# Patient Record
Sex: Male | Born: 1950 | Race: White | Hispanic: No | Marital: Married
Health system: Southern US, Community
[De-identification: ages and names within clinical notes are randomized; demographics above are authoritative.]

---

## 2007-06-19 ENCOUNTER — Observation Stay (HOSPITAL_COMMUNITY): Admission: EM | Admit: 2007-06-19 | Discharge: 2007-06-20 | Payer: Self-pay | Admitting: Emergency Medicine

## 2011-03-23 NOTE — H&P (Signed)
NAMERONEY, YOUTZ NO.:  [id number]   MEDICAL RECORD NO.:  [id number]          PATIENT TYPE:  EMS   LOCATION:  ED                           FACILITY:  [facility]   PHYSICIAN:  Ladell Pier, M.D.   DATE OF BIRTH:  07/27/1951   DATE OF ADMISSION:  06/19/2007  DATE OF DISCHARGE:                              HISTORY & PHYSICAL   CHIEF COMPLAINT:  Lightheadedness.   HISTORY OF PRESENT ILLNESS:  The patient is a 60 year old white male  that presented to the ER with nausea and dry heaves this a.m.  Per the  patient this morning he had a banana and some crackers and went to work.  He had penicillin that he needed to take for a dental procedure. He took  the B penicillin and antibiotics.  About 15 minutes after taking the  antibiotics, he started feeling really lightheaded and dizzy and clammy  with tingling in both arms.  He just continued to feel worse so he went  to the health nurse. He had his blood pressure second checked it was in  the 140s systolic.  911 was called. On arriving, he was noted to have a  blood pressure of 95/37 and hypothermic.  He stated that he had a  similar event back in April when he was admitted to [facility].  This happened about 15-20 minutes after taking tetracycline,  doxycycline, and antibiotics for a tick bite.  He states that he became  clammy, sweaty then had a new syncopal episode.  He was admitted to  [facility] and had a lot a workup done including a stress test,  CAT scan of the head.  Carotid Dopplers all normal.  He had lots of  blood work done at the time.  He stated that he followed up with  neurosurgery for carotid Dopplers and they would normally follow up with  cardiology even just a few weeks ago and everything checked out fine  except for the elevated blood pressure for which he was given a  prescription for blood pressure medicine but has not refilled it yet.  He had no chest pain or shortness of breath. This  time he had chest pain  and shortness of breath on the previous occasion.  He stated that he has  taken penicillin in the past but he had not had a problem with the  medication before.   PAST MEDICAL HISTORY:  Significant for hypertension and status post  appendectomy, also migraine headaches.   FAMILY HISTORY:  Mother is 55 and healthy.  Father died at 31 from lung  cancer.   SOCIAL HISTORY:  Tobacco, he smokes 1-1/2 packs per day and drinks  socially. He is married one child. He is an Retail banker.   MEDICATIONS:  None at present.   ALLERGIES:  Before none but now has PENICILLIN and TETRACYCLINE on the  list.   REVIEW OF SYSTEMS:  As per stated in the HPI.   PHYSICAL EXAM:  VITAL SIGNS:  Temperature 95.1 on admission with a bear  hugger, now his temperature is 98.3, blood pressure  95/37 now 118/49,  pulse of 72, respirations 20, pulse ox 93% on room air.  HEENT:  Normocephalic, atraumatic.  Pupils reactive to light. Throat  without erythema.  CARDIOVASCULAR:  Regular rate and rhythm.  LUNGS:  Clear bilaterally.  ABDOMEN:  Positive bowel sounds.  EXTREMITIES:  Without edema.  NEUROLOGIC:  Nonfocal.   LABORATORY DATA:  WBC 18.7, hemoglobin 17.68, platelets 443, MCV 97.4.  Sodium 137, potassium 3.3, chloride 107, CO2 22. BUN 9, creatinine 0.8,  glucose 166.  Cardiac enzymes normal. EKG no acute ST-segment elevation  or depression.  Chest x-ray shows atelectasis, no active disease.  UA  negative.   ASSESSMENT/PLAN:  1. Near-syncopal episode:  Sounds like he had an anaphylactic reaction      to the antibiotic on both occasions.  Will admit the patient for      observation. Will monitor in tele and check blood pressure, give IV      fluids and do blood cultures x2 with the elevated white count.  2. Leukocytosis:  This could be a left shift secondary to stress      reaction.  Will recheck his white count and check blood cultures      x2.  3. Hypertension, blood pressure  looks good now.  Continue IV fluids.  4. Tobacco use. Encourage smoking cessation.  5. Obesity. Encourage diet and exercise.      Ladell Pier, M.D.  Electronically Signed     NJ/MEDQ  D:  06/19/2007  T:  06/20/2007  Job:  [id number]

## 2011-03-23 NOTE — Discharge Summary (Signed)
NAMECOLBIN, JOVEL               ACCOUNT NO.:  [id number]   MEDICAL RECORD NO.:  [id number]          PATIENT TYPE:  INP   LOCATION:  1444                         FACILITY:  [facility]   PHYSICIAN:  Ladell Pier, M.D.   DATE OF BIRTH:  August 27, 1951   DATE OF ADMISSION:  06/19/2007  DATE OF DISCHARGE:                               DISCHARGE SUMMARY   DISCHARGE DIAGNOSES:  1. Near syncopal episode with lightheadedness secondary to most likely      anaphylactic reaction to taking penicillin.  2. Leukocytosis, resolved.  3. Hypertension.  4. Tobacco use.  5. Obesity.   DISCHARGE MEDICATIONS:  1. Patient to take the blood pressure medicine given to him by his      cardiologist.  2. Also aspirin 81 mg daily.  3. Ambien 10 mg at bedtime p.r.n. for sleep.   FOLLOW-UP APPOINTMENTS:  The patient is to follow up with PCP in 1-2  weeks.   PROCEDURES:  None.   CONSULTANTS:  None.   HISTORY OF PRESENT ILLNESS:  The patient is a 60 year old white male who  presented to the ER with nausea, dry heaves since that morning. Per the  patient that morning he had a banana and some crackers and went to work.  He had a penicillin that he needed to take prior to getting a dental  procedure. He took this penicillin and about 15 minutes after taking the  antibiotic he started feeling real lightheaded and dizzy, clammy, with  tingling in both arms. He continued to feel worse so he went to the  health nurse. He had his blood pressure checked and it was 140 systolic;  911 was called. On arrival he was noted to have a blood pressure of  95/37, he was hypothermic. Stated that he had a similar event back in  April when he was admitted to [facility] and that happened a few  minutes after he took tetracycline for a tick bite. He states that he  became clammy, sweaty, with near syncopal episode then. He had an  extensive work up at [facility] without any conclusive diagnosis.   Past medical history, family  history, social history, medications,  allergies, review of systems: Refer to admission H and P.   CONDITION ON DISCHARGE:  Temperature 989.3, pulse 67, respirations 18,  blood pressure 156/70, pulse ox 95% on room air. HEENT, head is  normocephalic, atraumatic. Pupils are equal, round and reactive to  light. No erythema. Cardiovascular shows a regular rate and rhythm.  Lungs are clear bilaterally. Abdomen has positive bowel sounds.  Extremities are without edema.   HOSPITAL COURSE:  1. Anaphylactic reaction. This most likely was secondary to the      penicillin and probably the first time secondary to tetracycline.      Discussed with him to add penicillin and tetracycline to his      allergy list and not to take any antibiotic in that class in the      future. Also discussed this with the wife.  2. Hypertension. He just got his blood pressure medication filled from  the cardiologist, but he has not started it. He will start it when      he goes home.  3. Tobacco. Counseled smoking cessation.  4. Leukocytosis, resolved.   DISCHARGE LABS:  Blood cultures negative x2. BMP: Sodium 140, potassium  3.7, chloride 110, CO2 23, BUN 5, creatinine 0.74, glucose 108. WBC 9.9,  hemoglobin 13.9, platelets 309. TSH 0.531. D-dimer 0.43.   Chest x-ray showed mild left basilar atelectasis/scarring.      Ladell Pier, M.D.  Electronically Signed     NJ/MEDQ  D:  06/20/2007  T:  06/21/2007  Job:  [id number]

## 2011-08-23 LAB — CBC
HCT: 39.2
HCT: 49.1
Hemoglobin: 13.9
Hemoglobin: 17.3 — ABNORMAL HIGH
MCHC: 35.2
MCV: 97.8
RBC: 5.02
RDW: 13
RDW: 13
WBC: 9.9

## 2011-08-23 LAB — URINALYSIS, ROUTINE W REFLEX MICROSCOPIC
Bilirubin Urine: NEGATIVE
Glucose, UA: NEGATIVE
Hgb urine dipstick: NEGATIVE
Ketones, ur: NEGATIVE
Leukocytes, UA: NEGATIVE
Protein, ur: 30 — AB
pH: 6.5

## 2011-08-23 LAB — DIFFERENTIAL
Basophils Absolute: 0
Basophils Relative: 0
Eosinophils Absolute: 0
Eosinophils Relative: 0
Monocytes Absolute: 0.5
Monocytes Relative: 3
Neutro Abs: 15 — ABNORMAL HIGH

## 2011-08-23 LAB — COMPREHENSIVE METABOLIC PANEL
ALT: 21
Albumin: 3.5
Alkaline Phosphatase: 64
BUN: 9
Chloride: 107
Potassium: 3.3 — ABNORMAL LOW
Sodium: 137
Total Bilirubin: 0.5
Total Protein: 6.1

## 2011-08-23 LAB — D-DIMER, QUANTITATIVE: D-Dimer, Quant: 0.43

## 2011-08-23 LAB — BASIC METABOLIC PANEL
BUN: 5 — ABNORMAL LOW
Chloride: 110
GFR calc non Af Amer: >60
Glucose, Bld: 108 — ABNORMAL HIGH
Potassium: 3.7
Sodium: 140

## 2011-08-23 LAB — CULTURE, BLOOD (ROUTINE X 2): Culture: NO GROWTH

## 2011-08-23 LAB — CK TOTAL AND CKMB (NOT AT ARMC): CK, MB: 3.4

## 2016-10-12 DIAGNOSIS — Z720 Tobacco use: Secondary | ICD-10-CM | POA: Diagnosis not present

## 2016-10-12 DIAGNOSIS — G43709 Chronic migraine without aura, not intractable, without status migrainosus: Secondary | ICD-10-CM | POA: Diagnosis not present

## 2016-10-12 DIAGNOSIS — E785 Hyperlipidemia, unspecified: Secondary | ICD-10-CM | POA: Diagnosis not present

## 2016-10-12 DIAGNOSIS — Z125 Encounter for screening for malignant neoplasm of prostate: Secondary | ICD-10-CM | POA: Diagnosis not present

## 2016-10-12 DIAGNOSIS — I10 Essential (primary) hypertension: Secondary | ICD-10-CM | POA: Diagnosis not present

## 2016-10-12 DIAGNOSIS — F5104 Psychophysiologic insomnia: Secondary | ICD-10-CM | POA: Diagnosis not present

## 2016-12-03 DIAGNOSIS — F5104 Psychophysiologic insomnia: Secondary | ICD-10-CM | POA: Diagnosis not present

## 2016-12-03 DIAGNOSIS — G5793 Unspecified mononeuropathy of bilateral lower limbs: Secondary | ICD-10-CM | POA: Diagnosis not present

## 2016-12-03 DIAGNOSIS — I10 Essential (primary) hypertension: Secondary | ICD-10-CM | POA: Diagnosis not present

## 2016-12-03 DIAGNOSIS — N400 Enlarged prostate without lower urinary tract symptoms: Secondary | ICD-10-CM | POA: Diagnosis not present

## 2016-12-03 DIAGNOSIS — F17210 Nicotine dependence, cigarettes, uncomplicated: Secondary | ICD-10-CM | POA: Diagnosis not present

## 2016-12-03 DIAGNOSIS — F4322 Adjustment disorder with anxiety: Secondary | ICD-10-CM | POA: Diagnosis not present

## 2016-12-03 DIAGNOSIS — K219 Gastro-esophageal reflux disease without esophagitis: Secondary | ICD-10-CM | POA: Diagnosis not present

## 2016-12-03 DIAGNOSIS — E876 Hypokalemia: Secondary | ICD-10-CM | POA: Diagnosis not present

## 2017-04-22 DIAGNOSIS — D2272 Melanocytic nevi of left lower limb, including hip: Secondary | ICD-10-CM | POA: Diagnosis not present

## 2017-04-22 DIAGNOSIS — D2261 Melanocytic nevi of right upper limb, including shoulder: Secondary | ICD-10-CM | POA: Diagnosis not present

## 2017-04-22 DIAGNOSIS — D225 Melanocytic nevi of trunk: Secondary | ICD-10-CM | POA: Diagnosis not present

## 2017-04-22 DIAGNOSIS — L570 Actinic keratosis: Secondary | ICD-10-CM | POA: Diagnosis not present

## 2017-04-22 DIAGNOSIS — L821 Other seborrheic keratosis: Secondary | ICD-10-CM | POA: Diagnosis not present

## 2017-04-22 DIAGNOSIS — X32XXXA Exposure to sunlight, initial encounter: Secondary | ICD-10-CM | POA: Diagnosis not present

## 2017-06-13 DIAGNOSIS — I10 Essential (primary) hypertension: Secondary | ICD-10-CM | POA: Diagnosis not present

## 2017-06-13 DIAGNOSIS — F4322 Adjustment disorder with anxiety: Secondary | ICD-10-CM | POA: Diagnosis not present

## 2017-06-13 DIAGNOSIS — K219 Gastro-esophageal reflux disease without esophagitis: Secondary | ICD-10-CM | POA: Diagnosis not present

## 2017-06-13 DIAGNOSIS — I872 Venous insufficiency (chronic) (peripheral): Secondary | ICD-10-CM | POA: Diagnosis not present

## 2017-06-13 DIAGNOSIS — F5104 Psychophysiologic insomnia: Secondary | ICD-10-CM | POA: Diagnosis not present

## 2017-09-07 DIAGNOSIS — M25532 Pain in left wrist: Secondary | ICD-10-CM | POA: Diagnosis not present

## 2017-09-07 DIAGNOSIS — G8929 Other chronic pain: Secondary | ICD-10-CM | POA: Diagnosis not present

## 2017-10-05 DIAGNOSIS — M25562 Pain in left knee: Secondary | ICD-10-CM | POA: Diagnosis not present

## 2017-10-05 DIAGNOSIS — M1711 Unilateral primary osteoarthritis, right knee: Secondary | ICD-10-CM | POA: Diagnosis not present

## 2017-10-05 DIAGNOSIS — M25561 Pain in right knee: Secondary | ICD-10-CM | POA: Diagnosis not present

## 2017-10-05 DIAGNOSIS — G8929 Other chronic pain: Secondary | ICD-10-CM | POA: Diagnosis not present

## 2018-05-24 ENCOUNTER — Other Ambulatory Visit
Admission: RE | Admit: 2018-05-24 | Discharge: 2018-05-24 | Disposition: A | Discharge status: Home / Self Care | Payer: Medicare HMO | Source: Ambulatory Visit | Attending: Gastroenterology | Admitting: Gastroenterology

## 2018-05-24 DIAGNOSIS — K529 Noninfective gastroenteritis and colitis, unspecified: Secondary | ICD-10-CM | POA: Insufficient documentation

## 2018-05-24 LAB — GASTROINTESTINAL PANEL BY PCR, STOOL (REPLACES STOOL CULTURE)

## 2018-05-24 LAB — C DIFFICILE QUICK SCREEN W PCR REFLEX
C DIFFICILE (CDIFF) TOXIN: NEGATIVE
C Diff antigen: NEGATIVE
C Diff interpretation: NOT DETECTED

## 2018-11-15 ENCOUNTER — Other Ambulatory Visit: Payer: Self-pay | Admitting: Family Medicine

## 2018-11-15 DIAGNOSIS — L03115 Cellulitis of right lower limb: Secondary | ICD-10-CM

## 2018-11-15 DIAGNOSIS — M7989 Other specified soft tissue disorders: Secondary | ICD-10-CM

## 2018-11-23 ENCOUNTER — Ambulatory Visit: Payer: Medicare HMO | Attending: Family Medicine

## 2018-12-08 ENCOUNTER — Ambulatory Visit: Admission: RE | Admit: 2018-12-08 | Payer: Medicare HMO | Source: Home / Self Care | Admitting: Gastroenterology

## 2018-12-08 ENCOUNTER — Encounter: Admission: RE | Payer: Self-pay | Source: Home / Self Care

## 2018-12-08 SURGERY — COLONOSCOPY WITH PROPOFOL
Anesthesia: General

## 2019-03-29 ENCOUNTER — Other Ambulatory Visit: Payer: Self-pay | Admitting: Unknown Physician Specialty

## 2019-03-29 ENCOUNTER — Other Ambulatory Visit (HOSPITAL_COMMUNITY): Payer: Self-pay | Admitting: Unknown Physician Specialty

## 2019-03-29 DIAGNOSIS — M25532 Pain in left wrist: Secondary | ICD-10-CM

## 2019-03-29 DIAGNOSIS — G8929 Other chronic pain: Secondary | ICD-10-CM

## 2019-04-05 ENCOUNTER — Other Ambulatory Visit (HOSPITAL_COMMUNITY): Payer: Self-pay | Admitting: Physician Assistant

## 2019-04-05 ENCOUNTER — Ambulatory Visit (HOSPITAL_COMMUNITY)
Admission: RE | Admit: 2019-04-05 | Discharge: 2019-04-05 | Disposition: A | Discharge status: Home / Self Care | Payer: Medicare HMO | Source: Ambulatory Visit | Attending: Unknown Physician Specialty | Admitting: Unknown Physician Specialty

## 2019-04-05 ENCOUNTER — Other Ambulatory Visit (HOSPITAL_COMMUNITY): Payer: Self-pay | Admitting: Unknown Physician Specialty

## 2019-04-05 ENCOUNTER — Other Ambulatory Visit: Payer: Self-pay

## 2019-04-05 DIAGNOSIS — R52 Pain, unspecified: Secondary | ICD-10-CM | POA: Insufficient documentation

## 2019-04-05 DIAGNOSIS — G8929 Other chronic pain: Secondary | ICD-10-CM | POA: Diagnosis present

## 2019-04-05 DIAGNOSIS — M25532 Pain in left wrist: Secondary | ICD-10-CM | POA: Diagnosis present

## 2020-03-21 ENCOUNTER — Other Ambulatory Visit: Payer: Self-pay | Admitting: Orthopedic Surgery

## 2020-03-21 DIAGNOSIS — M5137 Other intervertebral disc degeneration, lumbosacral region: Secondary | ICD-10-CM

## 2020-03-21 DIAGNOSIS — G8929 Other chronic pain: Secondary | ICD-10-CM

## 2020-03-21 DIAGNOSIS — M5416 Radiculopathy, lumbar region: Secondary | ICD-10-CM

## 2020-06-16 ENCOUNTER — Other Ambulatory Visit: Payer: Medicare HMO

## 2020-07-28 ENCOUNTER — Other Ambulatory Visit: Payer: Medicare HMO

## 2020-10-01 ENCOUNTER — Other Ambulatory Visit: Payer: Self-pay | Admitting: Orthopedic Surgery

## 2020-10-01 DIAGNOSIS — M5442 Lumbago with sciatica, left side: Secondary | ICD-10-CM

## 2020-10-01 DIAGNOSIS — M5137 Other intervertebral disc degeneration, lumbosacral region: Secondary | ICD-10-CM

## 2020-10-01 DIAGNOSIS — M545 Low back pain, unspecified: Secondary | ICD-10-CM

## 2020-10-01 DIAGNOSIS — M5416 Radiculopathy, lumbar region: Secondary | ICD-10-CM

## 2020-10-01 DIAGNOSIS — G8929 Other chronic pain: Secondary | ICD-10-CM

## 2020-10-16 ENCOUNTER — Ambulatory Visit
Admission: RE | Admit: 2020-10-16 | Discharge: 2020-10-16 | Disposition: A | Discharge status: Home / Self Care | Payer: Medicare HMO | Source: Ambulatory Visit | Attending: Orthopedic Surgery | Admitting: Orthopedic Surgery

## 2020-10-16 ENCOUNTER — Other Ambulatory Visit: Payer: Self-pay

## 2020-10-16 DIAGNOSIS — G8929 Other chronic pain: Secondary | ICD-10-CM | POA: Insufficient documentation

## 2020-10-16 DIAGNOSIS — M545 Low back pain, unspecified: Secondary | ICD-10-CM | POA: Diagnosis present

## 2020-10-16 DIAGNOSIS — M5442 Lumbago with sciatica, left side: Secondary | ICD-10-CM | POA: Diagnosis present

## 2020-12-10 ENCOUNTER — Ambulatory Visit: Payer: Medicare HMO | Admitting: Dermatology

## 2020-12-31 ENCOUNTER — Ambulatory Visit: Payer: Medicare HMO | Admitting: Dermatology

## 2020-12-31 ENCOUNTER — Other Ambulatory Visit: Payer: Self-pay

## 2020-12-31 DIAGNOSIS — C4491 Basal cell carcinoma of skin, unspecified: Secondary | ICD-10-CM

## 2020-12-31 DIAGNOSIS — L821 Other seborrheic keratosis: Secondary | ICD-10-CM

## 2020-12-31 DIAGNOSIS — D229 Melanocytic nevi, unspecified: Secondary | ICD-10-CM

## 2020-12-31 DIAGNOSIS — L700 Acne vulgaris: Secondary | ICD-10-CM | POA: Diagnosis not present

## 2020-12-31 DIAGNOSIS — C44311 Basal cell carcinoma of skin of nose: Secondary | ICD-10-CM | POA: Diagnosis not present

## 2020-12-31 DIAGNOSIS — L578 Other skin changes due to chronic exposure to nonionizing radiation: Secondary | ICD-10-CM | POA: Diagnosis not present

## 2020-12-31 DIAGNOSIS — Z1283 Encounter for screening for malignant neoplasm of skin: Secondary | ICD-10-CM | POA: Diagnosis not present

## 2020-12-31 DIAGNOSIS — L905 Scar conditions and fibrosis of skin: Secondary | ICD-10-CM

## 2020-12-31 DIAGNOSIS — D1800 Hemangioma unspecified site: Secondary | ICD-10-CM

## 2020-12-31 DIAGNOSIS — D692 Other nonthrombocytopenic purpura: Secondary | ICD-10-CM

## 2020-12-31 DIAGNOSIS — L820 Inflamed seborrheic keratosis: Secondary | ICD-10-CM | POA: Diagnosis not present

## 2020-12-31 DIAGNOSIS — I872 Venous insufficiency (chronic) (peripheral): Secondary | ICD-10-CM

## 2020-12-31 DIAGNOSIS — D492 Neoplasm of unspecified behavior of bone, soft tissue, and skin: Secondary | ICD-10-CM

## 2020-12-31 DIAGNOSIS — L814 Other melanin hyperpigmentation: Secondary | ICD-10-CM

## 2020-12-31 DIAGNOSIS — C44310 Basal cell carcinoma of skin of unspecified parts of face: Secondary | ICD-10-CM

## 2020-12-31 HISTORY — DX: Basal cell carcinoma of skin, unspecified: C44.91

## 2020-12-31 NOTE — Progress Notes (Addendum)
New Patient Visit  Subjective  Juan Clark is a 70 y.o. male who presents for the following: Total body skin exam (Hx of AKs). The patient presents for Total-Body Skin Exam (TBSE) for skin cancer screening and mole check.  The following portions of the chart were reviewed this encounter and updated as appropriate:   Allergies  Meds  Problems  Med Hx  Surg Hx  Fam Hx     Review of Systems:  No other skin or systemic complaints except as noted in HPI or Assessment and Plan.  Objective  Well appearing patient in no apparent distress; mood and affect are within normal limits.  A full examination was performed including scalp, head, eyes, ears, nose, lips, neck, chest, axillae, abdomen, back, buttocks, bilateral upper extremities, bilateral lower extremities, hands, feet, fingers, toes, fingernails, and toenails. All findings within normal limits unless otherwise noted below.  Objective  face: Open comedones, cystic paps face  Objective  nasal bridge: scar  Objective  bil lower legs: Stasis changes, telangiectasias bil lower legs  Objective  Left Flank x 1: Erythematous keratotic pap 0.8cmk  Objective  L sup nasolabial: 0.8 x 0.5cm crusted ulcerative pap        Assessment & Plan    Lentigines - Scattered tan macules - Due to sun exposure - Benign-appering, observe - Recommend daily broad spectrum sunscreen SPF 30+ to sun-exposed areas, reapply every 2 hours as needed. - Call for any changes  Seborrheic Keratoses - Stuck-on, waxy, tan-brown papules and plaques  - Discussed benign etiology and prognosis. - Observe - Call for any changes  Melanocytic Nevi - Tan-brown and/or pink-flesh-colored symmetric macules and papules - Benign appearing on exam today - Observation - Call clinic for new or changing moles - Recommend daily use of broad spectrum spf 30+ sunscreen to sun-exposed areas.   Hemangiomas - Red papules - Discussed benign nature -  Observe - Call for any changes  Actinic Damage - Chronic, secondary to cumulative UV/sun exposure - diffuse scaly erythematous macules with underlying dyspigmentation - Recommend daily broad spectrum sunscreen SPF 30+ to sun-exposed areas, reapply every 2 hours as needed.  - Call for new or changing lesions.  Skin cancer screening performed today.  Purpura - Chronic; persistent and recurrent.  Treatable, but not curable. 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  Associated with long-term sun damage. Discussed use of Rx topical retinoids and other agents. Patient declines at this time. Benign, observe  Scar nasal bridge 2ndary to trauma in the past. Benign appearing, observe  Stasis dermatitis of both legs bil lower legs Benign, observe  Inflamed seborrheic keratosis Left Flank x 1  Epidermal / dermal shaving - Left Flank x 1  Lesion diameter (cm):  0.8 Informed consent: discussed and consent obtained   Timeout: patient name, date of birth, surgical site, and procedure verified   Procedure prep:  Patient was prepped and draped in usual sterile fashion Prep type:  Isopropyl alcohol Anesthesia: the lesion was anesthetized in a standard fashion   Anesthetic:  1% lidocaine w/ epinephrine 1-100,000 buffered w/ 8.4% NaHCO3 Instrument used: scissors   Hemostasis achieved with: pressure, aluminum chloride  and electrodesiccation   Outcome: patient tolerated procedure well   Post-procedure details: sterile dressing applied and wound care instructions given   Dressing type: bandage and petrolatum    Neoplasm of skin L sup nasolabial  Skin  excision  Lesion length (cm):  0.8 Lesion width (cm):  0.5 Margin per side (cm):  0.2 Total excision diameter (cm):  1.2 Informed consent: discussed and consent obtained   Timeout: patient name, date of birth, surgical site, and procedure verified   Procedure prep:  Patient was prepped and draped in usual sterile fashion Prep type:  Isopropyl alcohol Anesthesia: the lesion was anesthetized in a standard fashion   Anesthetic:  1% lidocaine w/ epinephrine 1-100,000 buffered w/ 8.4% NaHCO3 (2cc) Instrument used: #15 blade   Hemostasis achieved with: pressure   Hemostasis achieved with comment:  Electrocautery Outcome: patient tolerated procedure well with no complications   Post-procedure details: sterile dressing applied and wound care instructions given   Dressing type: bacitracin (Mupirocin)    Destruction of lesion Complexity: extensive   Destruction method: electrodesiccation and curettage   Informed consent: discussed and consent obtained   Timeout:  patient name, date of birth, surgical site, and procedure verified Procedure prep:  Patient was prepped and draped in usual sterile fashion Prep type:  Isopropyl alcohol Anesthesia: the lesion was anesthetized in a standard fashion   Anesthetic:  1% lidocaine w/ epinephrine 1-100,000 buffered w/ 8.4% NaHCO3 Curettage performed in three different directions: Yes   Electrodesiccation performed over the curetted area: Yes   Lesion length (cm):  0.8 Lesion width (cm):  0.5 Margin per side (cm):  0.2 Final wound size (cm):  1.2 Hemostasis achieved with:  pressure, aluminum chloride and electrodesiccation Outcome: patient tolerated procedure well with no complications   Post-procedure details: sterile dressing applied and wound care instructions given   Dressing type: bandage and petrolatum    Specimen 1 - Surgical pathology Differential Diagnosis: D48.5 R/O BCC  Check Margins: No 0.8 x 0.5cm crusted ulcerative pap EDC  today  Skin cancer screening  Return in about 6 months (around 06/30/2021) for f/u bx.  I, Othelia Pulling, RMA, am acting as scribe for Sarina Ser, MD .  Documentation: I have reviewed the above documentation for accuracy and completeness, and I agree with the above.  Sarina Ser, MD

## 2020-12-31 NOTE — Patient Instructions (Signed)

## 2021-01-03 ENCOUNTER — Encounter: Payer: Self-pay | Admitting: Dermatology

## 2021-01-05 ENCOUNTER — Encounter: Payer: Self-pay | Admitting: Dermatology

## 2021-01-05 NOTE — Addendum Note (Signed)
Addended by: Ralene Bathe on: 01/05/2021 09:54 AM   Modules accepted: Level of Service

## 2021-01-06 ENCOUNTER — Telehealth: Payer: Self-pay

## 2021-01-06 NOTE — Telephone Encounter (Signed)
-----   Message from Ralene Bathe, MD sent at 01/03/2021 11:06 AM EST ----- Diagnosis Skin , L sup nasolabial BASAL CELL CARCINOMA, NODULAR AND INFILTRATIVE PATTERNS  Cancer - BCC Already treated Recheck next visit - watch for recurrence

## 2021-01-06 NOTE — Telephone Encounter (Signed)
Discussed biopsy results with pt  °

## 2021-03-31 ENCOUNTER — Other Ambulatory Visit (HOSPITAL_COMMUNITY): Payer: Self-pay | Admitting: Family Medicine

## 2021-03-31 ENCOUNTER — Other Ambulatory Visit: Payer: Self-pay | Admitting: Family Medicine

## 2021-03-31 DIAGNOSIS — M79604 Pain in right leg: Secondary | ICD-10-CM

## 2021-03-31 DIAGNOSIS — M7989 Other specified soft tissue disorders: Secondary | ICD-10-CM

## 2021-04-02 ENCOUNTER — Ambulatory Visit
Admission: RE | Admit: 2021-04-02 | Discharge: 2021-04-02 | Disposition: A | Discharge status: Home / Self Care | Payer: Medicare HMO | Source: Ambulatory Visit | Attending: Family Medicine | Admitting: Family Medicine

## 2021-04-02 ENCOUNTER — Other Ambulatory Visit: Payer: Self-pay

## 2021-04-02 DIAGNOSIS — M79604 Pain in right leg: Secondary | ICD-10-CM

## 2021-04-02 DIAGNOSIS — M7989 Other specified soft tissue disorders: Secondary | ICD-10-CM | POA: Diagnosis present

## 2021-07-01 ENCOUNTER — Other Ambulatory Visit: Payer: Self-pay

## 2021-07-01 ENCOUNTER — Ambulatory Visit: Payer: Medicare HMO | Admitting: Dermatology

## 2021-07-01 DIAGNOSIS — D1800 Hemangioma unspecified site: Secondary | ICD-10-CM

## 2021-07-01 DIAGNOSIS — D229 Melanocytic nevi, unspecified: Secondary | ICD-10-CM

## 2021-07-01 DIAGNOSIS — I872 Venous insufficiency (chronic) (peripheral): Secondary | ICD-10-CM

## 2021-07-01 DIAGNOSIS — Z1283 Encounter for screening for malignant neoplasm of skin: Secondary | ICD-10-CM | POA: Diagnosis not present

## 2021-07-01 DIAGNOSIS — Z85828 Personal history of other malignant neoplasm of skin: Secondary | ICD-10-CM | POA: Diagnosis not present

## 2021-07-01 DIAGNOSIS — L821 Other seborrheic keratosis: Secondary | ICD-10-CM

## 2021-07-01 DIAGNOSIS — L814 Other melanin hyperpigmentation: Secondary | ICD-10-CM

## 2021-07-01 DIAGNOSIS — R238 Other skin changes: Secondary | ICD-10-CM | POA: Diagnosis not present

## 2021-07-01 DIAGNOSIS — L578 Other skin changes due to chronic exposure to nonionizing radiation: Secondary | ICD-10-CM

## 2021-07-01 NOTE — Patient Instructions (Signed)

## 2021-07-01 NOTE — Progress Notes (Signed)
   Follow-Up Visit   Subjective  Juan Clark is a 70 y.o. male who presents for the following: Annual Exam (Hx BCC - patient noticed a lesion on his R ear that he would like checked today). The patient presents for Total-Body Skin Exam (TBSE) for skin cancer screening and mole check.  The following portions of the chart were reviewed this encounter and updated as appropriate:   Allergies  Meds  Problems  Med Hx  Surg Hx  Fam Hx     Review of Systems:  No other skin or systemic complaints except as noted in HPI or Assessment and Plan.  Objective  Well appearing patient in no apparent distress; mood and affect are within normal limits.  A full examination was performed including scalp, head, eyes, ears, nose, lips, neck, chest, axillae, abdomen, back, buttocks, bilateral upper extremities, bilateral lower extremities, hands, feet, fingers, toes, fingernails, and toenails. All findings within normal limits unless otherwise noted below.  Right Ear Purple papule.  B/L leg Erythematous, scaly patches involving the ankle and distal lower leg with associated lower leg edema.    Assessment & Plan  Venous lake Right Ear Benign-appearing.  Observation.  Call clinic for new or changing lesions.  Recommend daily use of broad spectrum spf 30+ sunscreen to sun-exposed areas.    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   Skin cancer screening  Lentigines - Scattered tan macules - Due to sun exposure - Benign-appering, observe - Recommend daily broad spectrum sunscreen SPF 30+ to sun-exposed areas, reapply every 2 hours as needed. - Call for any changes  Seborrheic Keratoses - Stuck-on, waxy, tan-brown papules and/or plaques  - Benign-appearing - Discussed benign etiology and prognosis. - Observe - Call for any changes  Melanocytic Nevi - Tan-brown and/or pink-flesh-colored symmetric  macules and papules - Benign appearing on exam today - Observation - Call clinic for new or changing moles - Recommend daily use of broad spectrum spf 30+ sunscreen to sun-exposed areas.   Hemangiomas - Red papules - Discussed benign nature - Observe - Call for any changes  Actinic Damage - Chronic condition, secondary to cumulative UV/sun exposure - diffuse scaly erythematous macules with underlying dyspigmentation - Recommend daily broad spectrum sunscreen SPF 30+ to sun-exposed areas, reapply every 2 hours as needed.  - Staying in the shade or wearing long sleeves, sun glasses (UVA+UVB protection) and wide brim hats (4-inch brim around the entire circumference of the hat) are also recommended for sun protection.  - Call for new or changing lesions.  History of Basal Cell Carcinoma of the Skin - No evidence of recurrence today - Recommend regular full body skin exams - Recommend daily broad spectrum sunscreen SPF 30+ to sun-exposed areas, reapply every 2 hours as needed.  - Call if any new or changing lesions are noted between office visits  Skin cancer screening performed today.  Return in about 1 year (around 07/01/2022) for TBSE.  Luther Redo, CMA, am acting as scribe for Sarina Ser, MD . Documentation: I have reviewed the above documentation for accuracy and completeness, and I agree with the above.  Sarina Ser, MD

## 2021-07-02 ENCOUNTER — Encounter: Payer: Self-pay | Admitting: Dermatology

## 2021-12-28 IMAGING — US US EXTREM LOW VENOUS*R*
1 series · 13 of 24 positions shown · non-contrast
Comparison: None.

CLINICAL DATA: Right lower extremity pain and. History of
malignancy and smoking. Evaluate DVT.



[Series 1: us venous img lower uni right (dvt) · portal-venous · 13 of 37 slices shown]
[im 1/37]
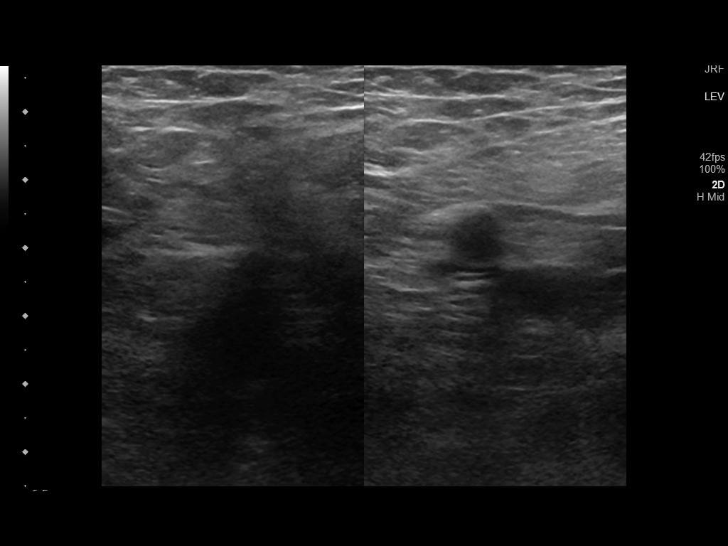
[im 4/37]
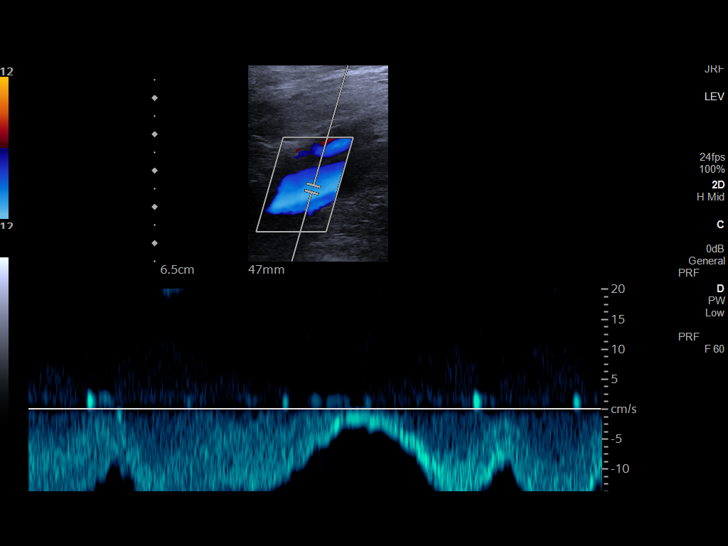
[im 7/37]
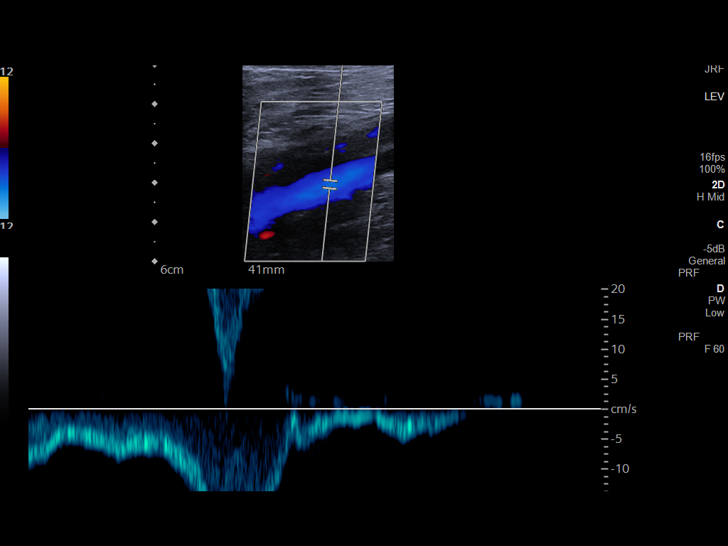
[im 10/37]
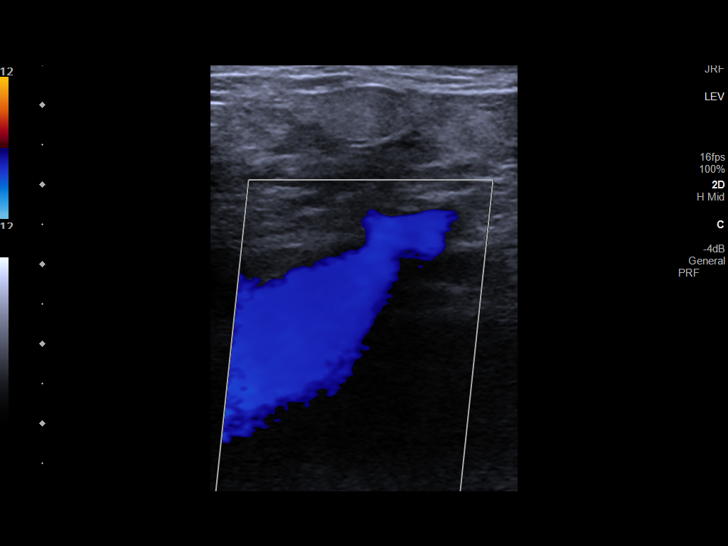
[im 13/37]
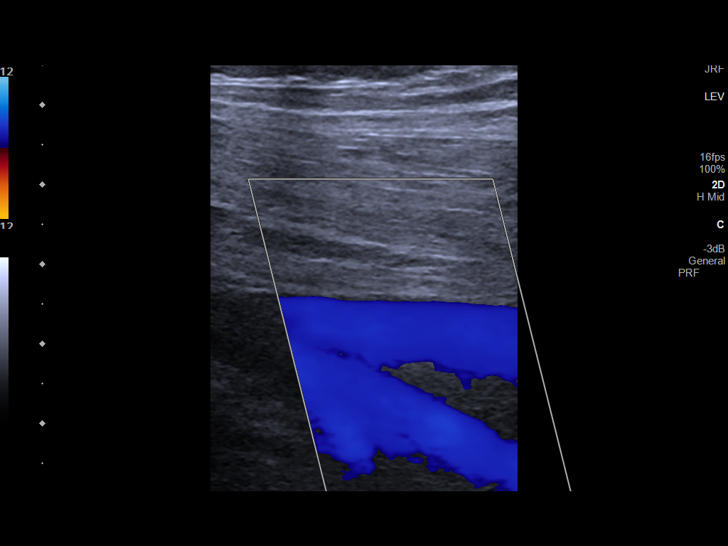
[im 16/37]
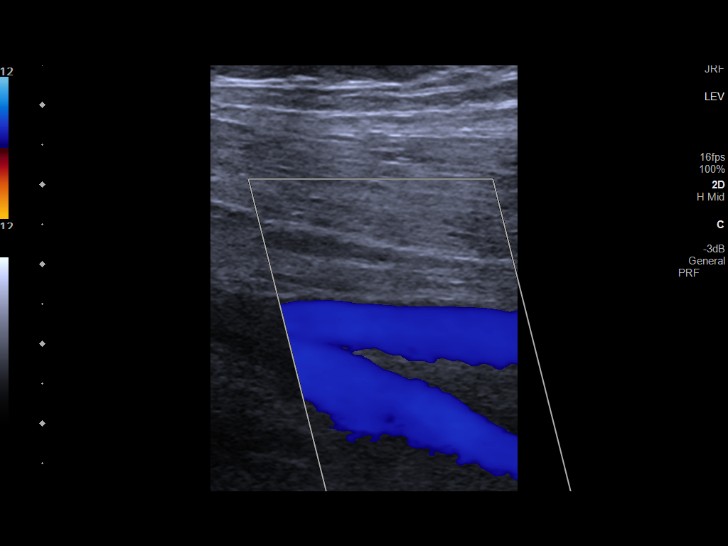
[im 19/37]
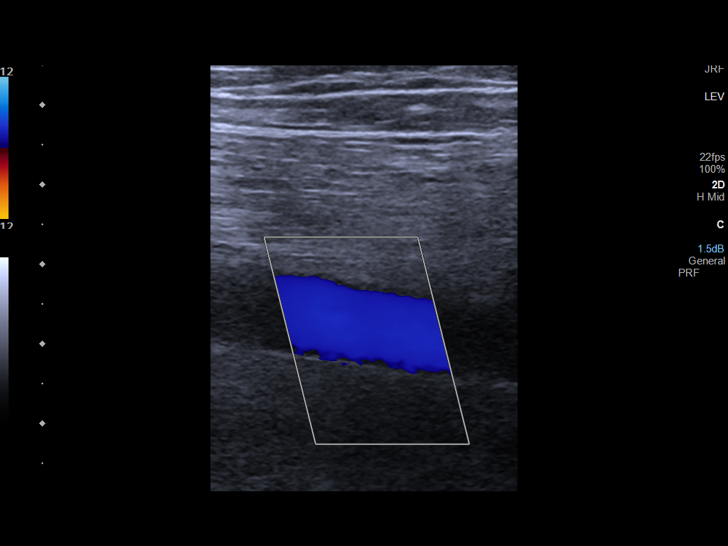
[im 21/37]
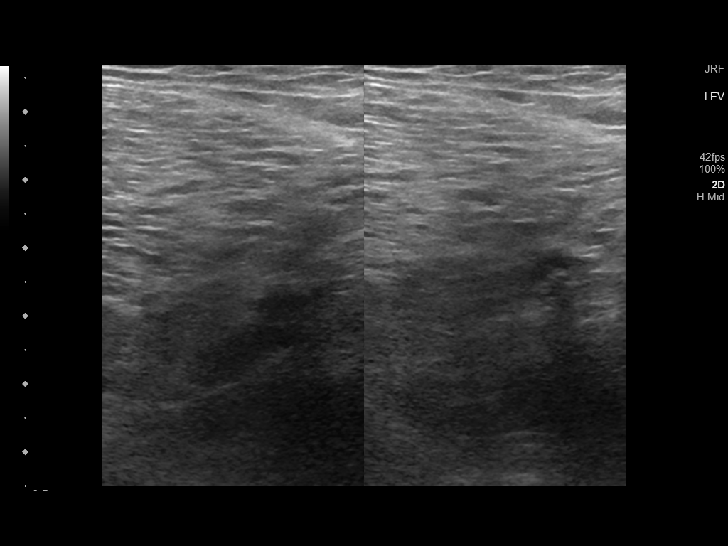
[im 24/37]
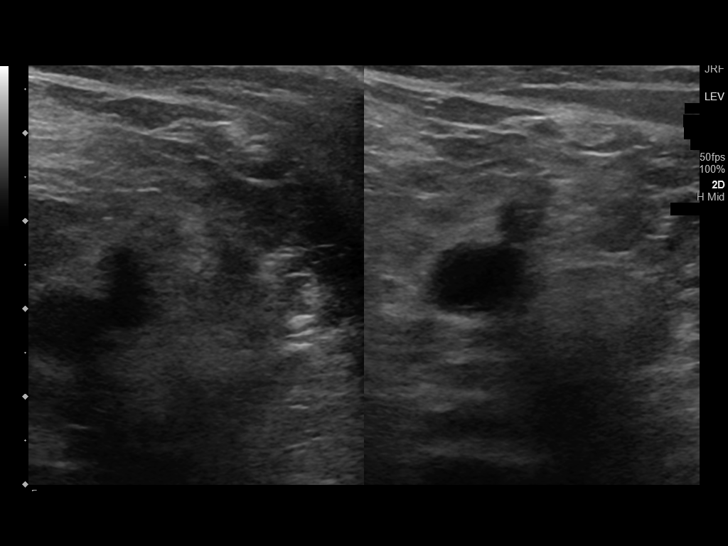
[im 27/37]
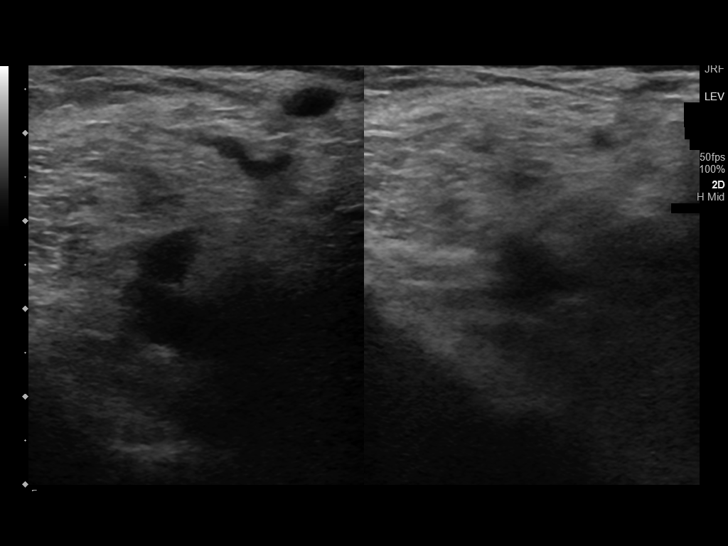
[im 30/37]
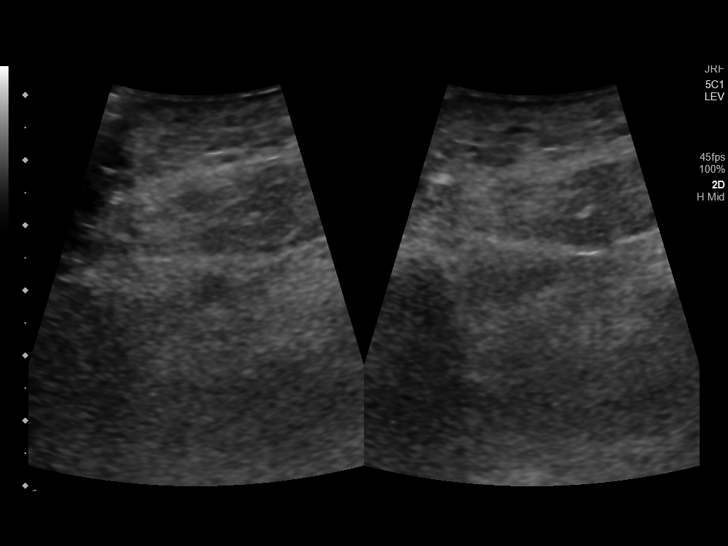
[im 33/37]
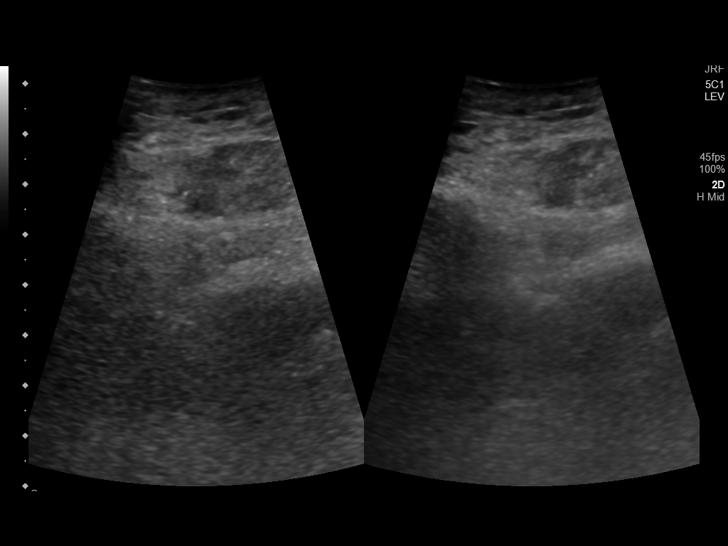
[im 37/37]
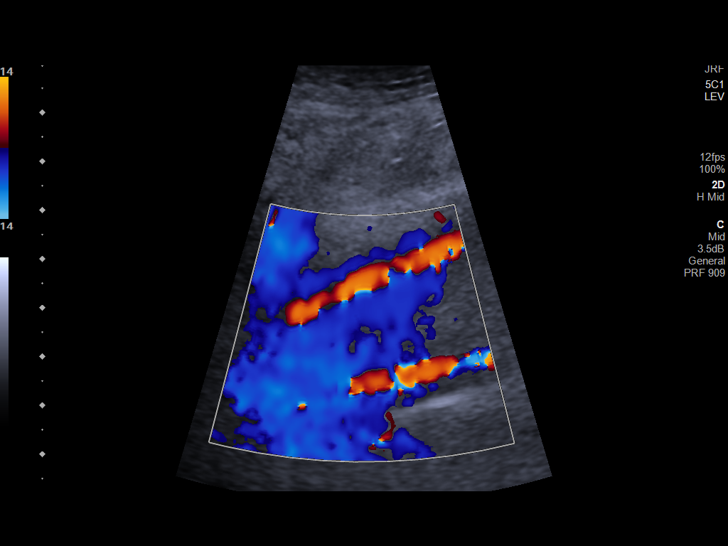

[13 of 24 positions shown; findings below may reference images not displayed]

FINDINGS: Contralateral Common Femoral Vein: Respiratory phasicity is normal
and symmetric with the symptomatic side. No evidence of thrombus.
Normal compressibility.

Common Femoral Vein: No evidence of thrombus. Normal
compressibility, respiratory phasicity and response to augmentation.

Saphenofemoral Junction: No evidence of thrombus. Normal
compressibility and flow on color Doppler imaging.

Profunda Femoral Vein: No evidence of thrombus. Normal
compressibility and flow on color Doppler imaging.

Femoral Vein: No evidence of thrombus. Normal compressibility,
respiratory phasicity and response to augmentation.

Popliteal Vein: No evidence of thrombus. Normal compressibility,
respiratory phasicity and response to augmentation.

Calf Veins: No evidence of thrombus. Normal compressibility and flow
on color Doppler imaging.

Superficial Great Saphenous Vein: No evidence of thrombus. Normal
compressibility.

Venous Reflux:  None.

Other Findings:  None.
IMPRESSION: No evidence of DVT within the right lower extremity.

## 2022-07-07 ENCOUNTER — Ambulatory Visit: Payer: Medicare HMO | Admitting: Dermatology

## 2022-07-07 ENCOUNTER — Encounter: Payer: Self-pay | Admitting: Dermatology

## 2022-07-07 DIAGNOSIS — L814 Other melanin hyperpigmentation: Secondary | ICD-10-CM

## 2022-07-07 DIAGNOSIS — D229 Melanocytic nevi, unspecified: Secondary | ICD-10-CM

## 2022-07-07 DIAGNOSIS — L720 Epidermal cyst: Secondary | ICD-10-CM

## 2022-07-07 DIAGNOSIS — D1800 Hemangioma unspecified site: Secondary | ICD-10-CM

## 2022-07-07 DIAGNOSIS — L578 Other skin changes due to chronic exposure to nonionizing radiation: Secondary | ICD-10-CM | POA: Diagnosis not present

## 2022-07-07 DIAGNOSIS — L820 Inflamed seborrheic keratosis: Secondary | ICD-10-CM | POA: Diagnosis not present

## 2022-07-07 DIAGNOSIS — L821 Other seborrheic keratosis: Secondary | ICD-10-CM

## 2022-07-07 DIAGNOSIS — I872 Venous insufficiency (chronic) (peripheral): Secondary | ICD-10-CM | POA: Diagnosis not present

## 2022-07-07 DIAGNOSIS — D692 Other nonthrombocytopenic purpura: Secondary | ICD-10-CM

## 2022-07-07 DIAGNOSIS — Z1283 Encounter for screening for malignant neoplasm of skin: Secondary | ICD-10-CM | POA: Diagnosis not present

## 2022-07-07 DIAGNOSIS — Z85828 Personal history of other malignant neoplasm of skin: Secondary | ICD-10-CM

## 2022-07-07 MED ORDER — MOMETASONE FUROATE 0.1 % EX CREA: TOPICAL_CREAM | CUTANEOUS | 3 refills | Status: DC

## 2022-07-07 NOTE — Progress Notes (Signed)
Follow-Up Visit   Subjective  Juan Clark is a 71 y.o. male who presents for the following: Annual Exam (Hx BCC ). The patient presents for Total-Body Skin Exam (TBSE) for skin cancer screening and mole check.  The patient has spots, moles and lesions to be evaluated, some may be new or changing and the patient has concerns that these could be cancer.  The following portions of the chart were reviewed this encounter and updated as appropriate:   Allergies  Meds  Problems  Med Hx  Surg Hx  Fam Hx     Review of Systems:  No other skin or systemic complaints except as noted in HPI or Assessment and Plan.  Objective  Well appearing patient in no apparent distress; mood and affect are within normal limits.  A full examination was performed including scalp, head, eyes, ears, nose, lips, neck, chest, axillae, abdomen, back, buttocks, bilateral upper extremities, bilateral lower extremities, hands, feet, fingers, toes, fingernails, and toenails. All findings within normal limits unless otherwise noted below.  B/L leg Woody induration with dyschromia of the lower legs.  L prox dorsum forearm x 2, L preauricular x 1, R lat calf x 1 (3) Erythematous stuck-on, waxy papule or plaque  L post neck 0.5 cm firm SQ nodule.    Assessment & Plan  Stasis dermatitis of both legs B/L leg Stasis in the legs causes chronic leg swelling, which may result in itchy or painful rashes, skin discoloration, skin texture changes, and sometimes ulceration.  Recommend daily graduated compression hose/stockings- easiest to put on first thing in morning, remove at bedtime.  Elevate legs as much as possible. Avoid salt/sodium rich foods.  Start Mometasone 0.1% cream QD-BID PRN. Continue Eucerin moisturizer daily.   mometasone (ELOCON) 0.1 % cream - B/L leg Apply to lower legs QD-BID up to 5d/wk.  Inflamed seborrheic keratosis (3) L prox dorsum forearm x 2, L preauricular x 1, R lat calf x 1 Destruction  of lesion - L prox dorsum forearm x 2, L preauricular x 1, R lat calf x 1 Complexity: simple   Destruction method: cryotherapy   Informed consent: discussed and consent obtained   Timeout:  patient name, date of birth, surgical site, and procedure verified Lesion destroyed using liquid nitrogen: Yes   Region frozen until ice ball extended beyond lesion: Yes   Outcome: patient tolerated procedure well with no complications   Post-procedure details: wound care instructions given    Epidermal inclusion cyst L post neck Benign-appearing. Exam most consistent with an epidermal inclusion cyst. Discussed that a cyst is a benign growth that can grow over time and sometimes get irritated or inflamed. Recommend observation if it is not bothersome. Discussed option of surgical excision to remove it if it is growing, symptomatic, or other changes noted. Please call for new or changing lesions so they can be evaluated.  Lentigines - Scattered tan macules - Due to sun exposure - Benign-appearing, observe - Recommend daily broad spectrum sunscreen SPF 30+ to sun-exposed areas, reapply every 2 hours as needed. - Call for any changes  Seborrheic Keratoses - Stuck-on, waxy, tan-brown papules and/or plaques  - Benign-appearing - Discussed benign etiology and prognosis. - Observe - Call for any changes  Melanocytic Nevi - Tan-brown and/or pink-flesh-colored symmetric macules and papules - Benign appearing on exam today - Observation - Call clinic for new or changing moles - Recommend daily use of broad spectrum spf 30+ sunscreen to sun-exposed areas.   Hemangiomas -  Red papules - Discussed benign nature - Observe - Call for any changes  Actinic Damage - Chronic condition, secondary to cumulative UV/sun exposure - diffuse scaly erythematous macules with underlying dyspigmentation - Recommend daily broad spectrum sunscreen SPF 30+ to sun-exposed areas, reapply every 2 hours as needed.  -  Staying in the shade or wearing long sleeves, sun glasses (UVA+UVB protection) and wide brim hats (4-inch brim around the entire circumference of the hat) are also recommended for sun protection.  - Call for new or changing lesions.  History of Basal Cell Carcinoma of the Skin - No evidence of recurrence today - Recommend regular full body skin exams - Recommend daily broad spectrum sunscreen SPF 30+ to sun-exposed areas, reapply every 2 hours as needed.  - Call if any new or changing lesions are noted between office visits  Purpura - Chronic; persistent and recurrent.  Treatable, but not curable. - Violaceous macules and patches - Benign - Related to trauma, age, sun damage and/or use of blood thinners, chronic use of topical and/or oral steroids - Observe - Can use OTC arnica containing moisturizer such as Dermend Bruise Formula if desired - Call for worsening or other concerns  Skin cancer screening performed today.  Return in about 1 year (around 07/08/2023) for TBSE.  Luther Redo, CMA, am acting as scribe for Sarina Ser, MD . Documentation: I have reviewed the above documentation for accuracy and completeness, and I agree with the above.  Sarina Ser, MD

## 2022-07-07 NOTE — Patient Instructions (Signed)
Due to recent changes in healthcare laws, you may see results of your pathology and/or laboratory studies on MyChart before the doctors have had a chance to review them. We understand that in some cases there may be results that are confusing or concerning to you. Please understand that not all results are received at the same time and often the doctors may need to interpret multiple results in order to provide you with the best plan of care or course of treatment. Therefore, we ask that you please give us 2 business days to thoroughly review all your results before contacting the office for clarification. Should we see a critical lab result, you will be contacted sooner.   If You Need Anything After Your Visit  If you have any questions or concerns for your doctor, please call our main line at [phone] and press option 4 to reach your doctor's medical assistant. If no one answers, please leave a voicemail as directed and we will return your call as soon as possible. Messages left after 4 pm will be answered the following business day.   You may also send us a message via MyChart. We typically respond to MyChart messages within 1-2 business days.  For prescription refills, please ask your pharmacy to contact our office. Our fax number is [phone].  If you have an urgent issue when the clinic is closed that cannot wait until the next business day, you can page your doctor at the number below.    Please note that while we do our best to be available for urgent issues outside of office hours, we are not available 24/7.   If you have an urgent issue and are unable to reach us, you may choose to seek medical care at your doctor's office, retail clinic, urgent care center, or emergency room.  If you have a medical emergency, please immediately call 911 or go to the emergency department.  Pager Numbers  - Dr. Kowalski: [phone]  - Dr. Moye: [phone]  - Dr. Stewart:  [phone]  In the event of inclement weather, please call our main line at [phone] for an update on the status of any delays or closures.  Dermatology Medication Tips: Please keep the boxes that topical medications come in in order to help keep track of the instructions about where and how to use these. Pharmacies typically print the medication instructions only on the boxes and not directly on the medication tubes.   If your medication is too expensive, please contact our office at [phone] option 4 or send us a message through MyChart.   We are unable to tell what your co-pay for medications will be in advance as this is different depending on your insurance coverage. However, we may be able to find a substitute medication at lower cost or fill out paperwork to get insurance to cover a needed medication.   If a prior authorization is required to get your medication covered by your insurance company, please allow us 1-2 business days to complete this process.  Drug prices often vary depending on where the prescription is filled and some pharmacies may offer cheaper prices.  The website www.goodrx.com contains coupons for medications through different pharmacies. The prices here do not account for what the cost may be with help from insurance (it may be cheaper with your insurance), but the website can give you the price if you did not use any insurance.  - You can print the associated coupon and take it with   your prescription to the pharmacy.  - You may also stop by our office during regular business hours and pick up a GoodRx coupon card.  - If you need your prescription sent electronically to a different pharmacy, notify our office through [place] MyChart or by phone at [phone] option 4.     Si Usted Necesita Algo Despus de Su Visita  Tambin puede enviarnos un mensaje a travs de MyChart. Por lo general respondemos a los mensajes de MyChart en el transcurso de 1 a 2  das hbiles.  Para renovar recetas, por favor pida a su farmacia que se ponga en contacto con nuestra oficina. Nuestro nmero de fax es el [phone].  Si tiene un asunto urgente cuando la clnica est cerrada y que no puede esperar hasta el siguiente da hbil, puede llamar/localizar a su doctor(a) al nmero que aparece a continuacin.   Por favor, tenga en cuenta que aunque hacemos todo lo posible para estar disponibles para asuntos urgentes fuera del horario de oficina, no estamos disponibles las 24 horas del da, los 7 das de la semana.   Si tiene un problema urgente y no puede comunicarse con nosotros, puede optar por buscar atencin mdica  en el consultorio de su doctor(a), en una clnica privada, en un centro de atencin urgente o en una sala de emergencias.  Si tiene una emergencia mdica, por favor llame inmediatamente al 911 o vaya a la sala de emergencias.  Nmeros de bper  - Dr. Kowalski: [phone]  - Dra. Moye: [phone]  - Dra. Stewart: [phone]  En caso de inclemencias del tiempo, por favor llame a nuestra lnea principal al [phone] para una actualizacin sobre el estado de cualquier retraso o cierre.  Consejos para la medicacin en dermatologa: Por favor, guarde las cajas en las que vienen los medicamentos de uso tpico para ayudarle a seguir las instrucciones sobre dnde y cmo usarlos. Las farmacias generalmente imprimen las instrucciones del medicamento slo en las cajas y no directamente en los tubos del medicamento.   Si su medicamento es muy caro, por favor, pngase en contacto con nuestra oficina llamando al [phone] y presione la opcin 4 o envenos un mensaje a travs de MyChart.   No podemos decirle cul ser su copago por los medicamentos por adelantado ya que esto es diferente dependiendo de la cobertura de su seguro. Sin embargo, es posible que podamos encontrar un medicamento sustituto a menor costo o llenar un formulario para que el  seguro cubra el medicamento que se considera necesario.   Si se requiere una autorizacin previa para que su compaa de seguros cubra su medicamento, por favor permtanos de 1 a 2 das hbiles para completar este proceso.  Los precios de los medicamentos varan con frecuencia dependiendo del lugar de dnde se surte la receta y alguna farmacias pueden ofrecer precios ms baratos.  El sitio web www.goodrx.com tiene cupones para medicamentos de diferentes farmacias. Los precios aqu no tienen en cuenta lo que podra costar con la ayuda del seguro (puede ser ms barato con su seguro), pero el sitio web puede darle el precio si no utiliz ningn seguro.  - Puede imprimir el cupn correspondiente y llevarlo con su receta a la farmacia.  - Tambin puede pasar por nuestra oficina durante el horario de atencin regular y recoger una tarjeta de cupones de GoodRx.  - Si necesita que su receta se enve electrnicamente a una farmacia diferente, informe a nuestra oficina a travs de MyChart de [place]   o por telfono llamando al [phone] y presione la opcin 4.  

## 2023-07-27 ENCOUNTER — Encounter: Payer: Self-pay | Admitting: Dermatology

## 2023-07-27 ENCOUNTER — Ambulatory Visit: Payer: Medicare HMO | Admitting: Dermatology

## 2023-07-27 DIAGNOSIS — Z7189 Other specified counseling: Secondary | ICD-10-CM

## 2023-07-27 DIAGNOSIS — I872 Venous insufficiency (chronic) (peripheral): Secondary | ICD-10-CM

## 2023-07-27 DIAGNOSIS — L814 Other melanin hyperpigmentation: Secondary | ICD-10-CM

## 2023-07-27 DIAGNOSIS — L821 Other seborrheic keratosis: Secondary | ICD-10-CM

## 2023-07-27 DIAGNOSIS — L820 Inflamed seborrheic keratosis: Secondary | ICD-10-CM | POA: Diagnosis not present

## 2023-07-27 DIAGNOSIS — Z85828 Personal history of other malignant neoplasm of skin: Secondary | ICD-10-CM

## 2023-07-27 DIAGNOSIS — D692 Other nonthrombocytopenic purpura: Secondary | ICD-10-CM

## 2023-07-27 DIAGNOSIS — L578 Other skin changes due to chronic exposure to nonionizing radiation: Secondary | ICD-10-CM

## 2023-07-27 DIAGNOSIS — Z1283 Encounter for screening for malignant neoplasm of skin: Secondary | ICD-10-CM

## 2023-07-27 DIAGNOSIS — W908XXA Exposure to other nonionizing radiation, initial encounter: Secondary | ICD-10-CM

## 2023-07-27 DIAGNOSIS — Z79899 Other long term (current) drug therapy: Secondary | ICD-10-CM

## 2023-07-27 DIAGNOSIS — B360 Pityriasis versicolor: Secondary | ICD-10-CM

## 2023-07-27 DIAGNOSIS — D1801 Hemangioma of skin and subcutaneous tissue: Secondary | ICD-10-CM

## 2023-07-27 MED ORDER — MOMETASONE FUROATE 0.1 % EX CREA: TOPICAL_CREAM | CUTANEOUS | 3 refills | Status: DC

## 2023-07-27 MED ORDER — KETOCONAZOLE 2 % EX SHAM: MEDICATED_SHAMPOO | CUTANEOUS | 11 refills | Status: AC

## 2023-07-27 NOTE — Progress Notes (Deleted)
Follow-Up Visit   Subjective  Juan Clark is a 72 y.o. male who presents for the following: Skin Cancer Screening and Full Body Skin Exam. Hx of BCC.  C/O few spots to check at groin area.   The patient presents for Total-Body Skin Exam (TBSE) for skin cancer screening and mole check. The patient has spots, moles and lesions to be evaluated, some may be new or changing and the patient may have concern these could be cancer.    The following portions of the chart were reviewed this encounter and updated as appropriate: medications, allergies, medical history  Review of Systems:  No other skin or systemic complaints except as noted in HPI or Assessment and Plan.  Objective  Well appearing patient in no apparent distress; mood and affect are within normal limits.  A full examination was performed including scalp, head, eyes, ears, nose, lips, neck, chest, axillae, abdomen, back, buttocks, bilateral upper extremities, bilateral lower extremities, hands, feet, fingers, toes, fingernails, and toenails. All findings within normal limits unless otherwise noted below.   Relevant physical exam findings are noted in the Assessment and Plan.  Scalp x2, left penile shaft x2 (4) Erythematous keratotic or waxy stuck-on papule or plaque.    Assessment & Plan   HISTORY OF BASAL CELL CARCINOMA OF THE SKIN. Left superior nasolabial, nodular. [facility] 12/31/2020 - No evidence of recurrence today - Recommend regular full body skin exams - Recommend daily broad spectrum sunscreen SPF 30+ to sun-exposed areas, reapply every 2 hours as needed.  - Call if any new or changing lesions are noted between office visits   SKIN CANCER SCREENING PERFORMED TODAY.  ACTINIC DAMAGE - Chronic condition, secondary to cumulative UV/sun exposure - diffuse scaly erythematous macules with underlying dyspigmentation - Recommend daily broad spectrum sunscreen SPF 30+ to sun-exposed areas, reapply every 2 hours as  needed.  - Staying in the shade or wearing long sleeves, sun glasses (UVA+UVB protection) and wide brim hats (4-inch brim around the entire circumference of the hat) are also recommended for sun protection.  - Call for new or changing lesions.  LENTIGINES, SEBORRHEIC KERATOSES, HEMANGIOMAS - Benign normal skin lesions - Benign-appearing - Call for any changes  MELANOCYTIC NEVI - Tan-brown and/or pink-flesh-colored symmetric macules and papules - Benign appearing on exam today - Observation - Call clinic for new or changing moles - Recommend daily use of broad spectrum spf 30+ sunscreen to sun-exposed areas.   Purpura - Chronic; persistent and recurrent.  Treatable, but not curable. - Violaceous macules and patches - Benign - Related to trauma, age, sun damage and/or use of blood thinners, chronic use of topical and/or oral steroids - Observe - Can use OTC arnica containing moisturizer such as Dermend Bruise Formula if desired - Call for worsening or other concerns  Tinea Versicolor  Chronic and persistent condition with duration or expected duration over one year. Condition is bothersome/symptomatic for patient. Currently flared.   Use ketoconazole shampoo as body wash 2-3 times a week, lather on body, leave on 8-10 minutes, wash off. Then use once a week as maintenance.    Tinea versicolor is a chronic recurrent skin rash causing discolored scaly spots most commonly seen on back, chest, and/or shoulders.  It is generally asymptomatic. The rash is due to overgrowth of a common type of yeast present on everyone's skin and it is not contagious.  It tends to flare more in the summer due to increased sweating on trunk.  After rash is  treated, the scaliness will resolve, but the discoloration will take longer to return to normal pigmentation. The periodic use of an OTC medicated soap/shampoo with zinc or selenium sulfide can be helpful to prevent yeast overgrowth and recurrence.   STASIS  DERMATITIS Exam: Erythematous, scaly patches involving the ankle and distal lower leg with associated lower leg edema.    Follow-Up Visit   Subjective  Juan Clark is a 72 y.o. male who presents for the following: Skin Cancer Screening and Full Body Skin Exam  The patient presents for Total-Body Skin Exam (TBSE) for skin cancer screening and mole check. The patient has spots, moles and lesions to be evaluated, some may be new or changing and the patient may have concern these could be cancer.    The following portions of the chart were reviewed this encounter and updated as appropriate: medications, allergies, medical history  Review of Systems:  No other skin or systemic complaints except as noted in HPI or Assessment and Plan.  Objective  Well appearing patient in no apparent distress; mood and affect are within normal limits.  A full examination was performed including scalp, head, eyes, ears, nose, lips, neck, chest, axillae, abdomen, back, buttocks, bilateral upper extremities, bilateral lower extremities, hands, feet, fingers, toes, fingernails, and toenails. All findings within normal limits unless otherwise noted below.   Relevant physical exam findings are noted in the Assessment and Plan.  Scalp x2, left penile shaft x2 (4) Erythematous keratotic or waxy stuck-on papule or plaque.    Assessment & Plan   SKIN CANCER SCREENING PERFORMED TODAY.  ACTINIC DAMAGE - Chronic condition, secondary to cumulative UV/sun exposure - diffuse scaly erythematous macules with underlying dyspigmentation - Recommend daily broad spectrum sunscreen SPF 30+ to sun-exposed areas, reapply every 2 hours as needed.  - Staying in the shade or wearing long sleeves, sun glasses (UVA+UVB protection) and wide brim hats (4-inch brim around the entire circumference of the hat) are also recommended for sun protection.  - Call for new or changing lesions.  LENTIGINES, SEBORRHEIC KERATOSES,  HEMANGIOMAS - Benign normal skin lesions - Benign-appearing - Call for any changes  MELANOCYTIC NEVI - Tan-brown and/or pink-flesh-colored symmetric macules and papules - Benign appearing on exam today - Observation - Call clinic for new or changing moles - Recommend daily use of broad spectrum spf 30+ sunscreen to sun-exposed areas.   Stasis in the legs causes chronic leg swelling, which may result in itchy or painful rashes, skin discoloration, skin texture changes, and sometimes ulceration.  Recommend daily graduated compression hose/stockings- easiest to put on first thing in morning, remove at bedtime.  Elevate legs as much as possible. Avoid salt/sodium rich foods.  Treatment Plan: Continue Mometasone cream once or twice daily up to 5 days a week.  Wear compression socks as directed.    Inflamed seborrheic keratosis (4) Scalp x2, left penile shaft x2  Symptomatic, irritating, patient would like treated.  Destruction of lesion - Scalp x2, left penile shaft x2 (4) Complexity: simple   Destruction method: cryotherapy   Informed consent: discussed and consent obtained   Timeout:  patient name, date of birth, surgical site, and procedure verified Lesion destroyed using liquid nitrogen: Yes   Region frozen until ice ball extended beyond lesion: Yes   Outcome: patient tolerated procedure well with no complications   Post-procedure details: wound care instructions given   Additional details:  Prior to procedure, discussed risks of blister formation, small wound, skin dyspigmentation, or rare scar following  cryotherapy. Recommend Vaseline ointment to treated areas while healing.   Stasis dermatitis of both legs  Related Medications mometasone (ELOCON) 0.1 % cream Apply to lower legs QD-BID up to 5d/wk.   Return in about 1 year (around 07/26/2024) for TBSE; ISK recheck in 4 months.  I, Lawson Radar, CMA, am acting as scribe for Armida Sans, MD.   Documentation: I have  reviewed the above documentation for accuracy and completeness, and I agree with the above.  Armida Sans, MD

## 2023-07-27 NOTE — Patient Instructions (Addendum)
Cryotherapy Aftercare  Wash gently with soap and water everyday.   Apply Vaseline Jelly daily until healed.   Use ketoconazole shampoo as body wash 2-3 times a week, lather on body, leave on 8-10 minutes, wash off. Then use once a week as maintenance.   Continue Mometasone cream once or twice daily up to 5 days a week to legs. Wear compression socks daily as directed.  Seborrheic Keratosis  What causes seborrheic keratoses? Seborrheic keratoses are harmless, common skin growths that first appear during adult life.  As time goes by, more growths appear.  Some people may develop a large number of them.  Seborrheic keratoses appear on both covered and uncovered body parts.  They are not caused by sunlight.  The tendency to develop seborrheic keratoses can be inherited.  They vary in color from skin-colored to gray, brown, or even black.  They can be either smooth or have a rough, warty surface.   Seborrheic keratoses are superficial and look as if they were stuck on the skin.  Under the microscope this type of keratosis looks like layers upon layers of skin.  That is why at times the top layer may seem to fall off, but the rest of the growth remains and re-grows.    Treatment Seborrheic keratoses do not need to be treated, but can easily be removed in the office.  Seborrheic keratoses often cause symptoms when they rub on clothing or jewelry.  Lesions can be in the way of shaving.  If they become inflamed, they can cause itching, soreness, or burning.  Removal of a seborrheic keratosis can be accomplished by freezing, burning, or surgery. If any spot bleeds, scabs, or grows rapidly, please return to have it checked, as these can be an indication of a skin cancer.   Recommend daily broad spectrum sunscreen SPF 30+ to sun-exposed areas, reapply every 2 hours as needed. Call for new or changing lesions.  Staying in the shade or wearing long sleeves, sun glasses (UVA+UVB protection) and wide brim hats  (4-inch brim around the entire circumference of the hat) are also recommended for sun protection.    Melanoma ABCDEs  Melanoma is the most dangerous type of skin cancer, and is the leading cause of death from skin disease.  You are more likely to develop melanoma if you: Have light-colored skin, light-colored eyes, or red or blond hair Spend a lot of time in the sun Tan regularly, either outdoors or in a tanning bed Have had blistering sunburns, especially during childhood Have a close family member who has had a melanoma Have atypical moles or large birthmarks  Early detection of melanoma is key since treatment is typically straightforward and cure rates are extremely high if we catch it early.   The first sign of melanoma is often a change in a mole or a new dark spot.  The ABCDE system is a way of remembering the signs of melanoma.  A for asymmetry:  The two halves do not match. B for border:  The edges of the growth are irregular. C for color:  A mixture of colors are present instead of an even brown color. D for diameter:  Melanomas are usually (but not always) greater than 6mm - the size of a pencil eraser. E for evolution:  The spot keeps changing in size, shape, and color.  Please check your skin once per month between visits. You can use a small mirror in front and a large mirror behind you to  keep an eye on the back side or your body.   If you see any new or changing lesions before your next follow-up, please call to schedule a visit.  Please continue daily skin protection including broad spectrum sunscreen SPF 30+ to sun-exposed areas, reapplying every 2 hours as needed when you're outdoors.   Staying in the shade or wearing long sleeves, sun glasses (UVA+UVB protection) and wide brim hats (4-inch brim around the entire circumference of the hat) are also recommended for sun protection.      Due to recent changes in healthcare laws, you may see results of your pathology  and/or laboratory studies on MyChart before the doctors have had a chance to review them. We understand that in some cases there may be results that are confusing or concerning to you. Please understand that not all results are received at the same time and often the doctors may need to interpret multiple results in order to provide you with the best plan of care or course of treatment. Therefore, we ask that you please give [place] 2 business days to thoroughly review all your results before contacting the office for clarification. Should we see a critical lab result, you will be contacted sooner.   If You Need Anything After Your Visit  If you have any questions or concerns for your doctor, please call our main line at [phone] and press option 4 to reach your doctor's medical assistant. If no one answers, please leave a voicemail as directed and we will return your call as soon as possible. Messages left after 4 pm will be answered the following business day.   You may also send [place] a message via MyChart. We typically respond to MyChart messages within 1-2 business days.  For prescription refills, please ask your pharmacy to contact our office. Our fax number is [phone].  If you have an urgent issue when the clinic is closed that cannot wait until the next business day, you can page your doctor at the number below.    Please note that while we do our best to be available for urgent issues outside of office hours, we are not available 24/7.   If you have an urgent issue and are unable to reach [place], you may choose to seek medical care at your doctor's office, retail clinic, urgent care center, or emergency room.  If you have a medical emergency, please immediately call 911 or go to the emergency department.  Pager Numbers  - Dr. Gwen Pounds: [phone]  - Dr. Roseanne Reno: [phone]  - Dr. Katrinka Blazing: [phone]   In the event of inclement weather, please call our main line at [phone]  for an update on the status of any delays or closures.  Dermatology Medication Tips: Please keep the boxes that topical medications come in in order to help keep track of the instructions about where and how to use these. Pharmacies typically print the medication instructions only on the boxes and not directly on the medication tubes.   If your medication is too expensive, please contact our office at [phone] option 4 or send [place] a message through MyChart.   We are unable to tell what your co-pay for medications will be in advance as this is different depending on your insurance coverage. However, we may be able to find a substitute medication at lower cost or fill out paperwork to get insurance to cover a needed medication.   If a prior authorization is required to get your medication covered  by your insurance company, please allow [place] 1-2 business days to complete this process.  Drug prices often vary depending on where the prescription is filled and some pharmacies may offer cheaper prices.  The website www.goodrx.com contains coupons for medications through different pharmacies. The prices here do not account for what the cost may be with help from insurance (it may be cheaper with your insurance), but the website can give you the price if you did not use any insurance.  - You can print the associated coupon and take it with your prescription to the pharmacy.  - You may also stop by our office during regular business hours and pick up a GoodRx coupon card.  - If you need your prescription sent electronically to a different pharmacy, notify our office through [facility] or by phone at [phone] option 4.     Si Usted Necesita Algo Despus de Su Visita  Tambin puede enviarnos un mensaje a travs de Clinical cytogeneticist. Por lo general respondemos a los mensajes de MyChart en el transcurso de 1 a 2 das hbiles.  Para renovar recetas, por favor pida a su farmacia que se ponga en contacto  con nuestra oficina. Annie Sable de fax es [place] [phone].  Si tiene un asunto urgente cuando la clnica est cerrada y que no puede esperar hasta el siguiente da hbil, puede llamar/localizar a su doctor(a) al nmero que aparece a continuacin.   Por favor, tenga en cuenta que aunque hacemos todo lo posible para estar disponibles para asuntos urgentes fuera del horario de [place], no estamos disponibles las 24 horas del da, los 7 [address]  [address] de la [place].   Si tiene un problema urgente y no puede comunicarse con nosotros, puede optar por buscar atencin mdica  en el consultorio de su doctor(a), en una clnica privada, en un centro de atencin urgente o en una sala de emergencias.  Si tiene Engineer, drilling, por favor llame inmediatamente al 911 o vaya a la sala de emergencias.  Nmeros de bper  - Dr. Gwen Pounds: [phone]  - Dra. Roseanne Reno: [phone]  - Dr. Katrinka Blazing: [phone]   En caso de inclemencias del tiempo, por favor llame a Lacy Duverney principal al [phone] para una actualizacin sobre el [place] de cualquier retraso o cierre.  Consejos para la medicacin en dermatologa: Por favor, guarde las cajas en las que vienen los medicamentos de uso tpico para ayudarle a seguir las instrucciones sobre dnde y cmo usarlos. Las farmacias generalmente imprimen las instrucciones del medicamento slo en las cajas y no directamente en los tubos del [place].   Si su medicamento es muy caro, por favor, pngase en contacto con Rolm Gala llamando al [phone] y presione la opcin 4 o envenos un mensaje a travs de Clinical cytogeneticist.   No podemos decirle cul ser su copago por los medicamentos por adelantado ya que esto es diferente dependiendo de la cobertura de su seguro. Sin embargo, es posible que podamos encontrar un medicamento sustituto a Audiological scientist un formulario para que el seguro cubra el medicamento que se considera necesario.   Si se requiere una autorizacin  previa para que su compaa de seguros [place] su medicamento, por favor permtanos de 1 a 2 das hbiles para completar [address]  Los precios de los medicamentos varan con frecuencia dependiendo del Environmental consultant de dnde se surte la receta y alguna farmacias pueden ofrecer precios ms baratos.  El sitio web www.goodrx.com tiene cupones para medicamentos de Health and safety inspector. Los precios aqu  no tienen en cuenta lo que podra costar con la ayuda del seguro (puede ser ms barato con su seguro), pero el sitio web puede darle el precio si no Visual merchandiser.  - Puede imprimir el cupn correspondiente y llevarlo con su receta a la farmacia.  - Tambin puede pasar por nuestra oficina durante el horario de atencin regular y Education officer, museum una tarjeta de cupones de GoodRx.  - Si necesita que su receta se enve electrnicamente a una farmacia diferente, informe a nuestra oficina a travs de MyChart de [place] o por telfono llamando al [phone] y presione la opcin 4.

## 2023-07-27 NOTE — Progress Notes (Signed)
Follow-Up Visit   Subjective  Juan Clark is a 72 y.o. male who presents for the following: Skin Cancer Screening and Full Body Skin Exam. H[facility]  The patient presents for Total-Body Skin Exam (TBSE) for skin cancer screening and mole check. The patient has spots, moles and lesions to be evaluated, some may be new or changing and the patient may have concern these could be cancer.    The following portions of the chart were reviewed this encounter and updated as appropriate: medications, allergies, medical history  Review of Systems:  No other skin or systemic complaints except as noted in HPI or Assessment and Plan.  Objective  Well appearing patient in no apparent distress; mood and affect are within normal limits.  A full examination was performed including scalp, head, eyes, ears, nose, lips, neck, chest, axillae, abdomen, back, buttocks, bilateral upper extremities, bilateral lower extremities, hands, feet, fingers, toes, fingernails, and toenails. All findings within normal limits unless otherwise noted below.   Relevant physical exam findings are noted in the Assessment and Plan.  Scalp x2, left penile shaft x2 (4) Erythematous keratotic or waxy stuck-on papule or plaque.    Assessment & Plan   HISTORY OF BASAL CELL CARCINOMA OF THE SKIN. L sup nasolabial. EDC 12/31/2020 - No evidence of recurrence today - Recommend regular full body skin exams - Recommend daily broad spectrum sunscreen SPF 30+ to sun-exposed areas, reapply every 2 hours as needed.  - Call if any new or changing lesions are noted between office visits   SKIN CANCER SCREENING PERFORMED TODAY.  ACTINIC DAMAGE - Chronic condition, secondary to cumulative UV/sun exposure - diffuse scaly erythematous macules with underlying dyspigmentation - Recommend daily broad spectrum sunscreen SPF 30+ to sun-exposed areas, reapply every 2 hours as needed.  - Staying in the shade or wearing long sleeves, sun  glasses (UVA+UVB protection) and wide brim hats (4-inch brim around the entire circumference of the hat) are also recommended for sun protection.  - Call for new or changing lesions.  LENTIGINES, SEBORRHEIC KERATOSES, HEMANGIOMAS - Benign normal skin lesions - Benign-appearing - Call for any changes  MELANOCYTIC NEVI - Tan-brown and/or pink-flesh-colored symmetric macules and papules - Benign appearing on exam today - Observation - Call clinic for new or changing moles - Recommend daily use of broad spectrum spf 30+ sunscreen to sun-exposed areas.    Tinea Versicolor  Chronic and persistent condition with duration or expected duration over one year. Condition is bothersome/symptomatic for patient. Currently flared.    Use ketoconazole shampoo as body wash 2-3 times a week, lather on body, leave on 8-10 minutes, wash off. Then use once a week as maintenance.    Tinea versicolor is a chronic recurrent skin rash causing discolored scaly spots most commonly seen on back, chest, and/or shoulders.  It is generally asymptomatic. The rash is due to overgrowth of a common type of yeast present on everyone's skin and it is not contagious.  It tends to flare more in the summer due to increased sweating on trunk.  After rash is treated, the scaliness will resolve, but the discoloration will take longer to return to normal pigmentation. The periodic use of an OTC medicated soap/shampoo with zinc or selenium sulfide can be helpful to prevent yeast overgrowth and recurrence.   STASIS DERMATITIS Exam: Erythematous, scaly patches involving the ankle and distal lower leg with associated lower leg edema.  Chronic and persistent condition with duration or expected duration over one year. Condition  is symptomatic/ bothersome to patient. Not currently at goal.   Stasis in the legs causes chronic leg swelling, which may result in itchy or painful rashes, skin discoloration, skin texture changes, and  sometimes ulceration.  Recommend daily graduated compression hose/stockings- easiest to put on first thing in morning, remove at bedtime.  Elevate legs as much as possible. Avoid salt/sodium rich foods.  Treatment Plan: Continue Mometasone cream once or twice daily up to 5 days a week to legs. Wear compression socks daily as directed.  Purpura - Chronic; persistent and recurrent.  Treatable, but not curable. - Violaceous macules and patches - Benign - Related to trauma, age, sun damage and/or use of blood thinners, chronic use of topical and/or oral steroids - Observe - Can use OTC arnica containing moisturizer such as Dermend Bruise Formula if desired - Call for worsening or other concerns    Inflamed seborrheic keratosis (4) Scalp x2, left penile shaft x2  Symptomatic, irritating, patient would like treated.  Destruction of lesion - Scalp x2, left penile shaft x2 (4) Complexity: simple   Destruction method: cryotherapy   Informed consent: discussed and consent obtained   Timeout:  patient name, date of birth, surgical site, and procedure verified Lesion destroyed using liquid nitrogen: Yes   Region frozen until ice ball extended beyond lesion: Yes   Outcome: patient tolerated procedure well with no complications   Post-procedure details: wound care instructions given   Additional details:  Prior to procedure, discussed risks of blister formation, small wound, skin dyspigmentation, or rare scar following cryotherapy. Recommend Vaseline ointment to treated areas while healing.   Stasis dermatitis of both legs  Related Medications mometasone (ELOCON) 0.1 % cream Apply to lower legs QD-BID up to 5d/wk.   Return in about 1 year (around 07/26/2024) for TBSE; ISK recheck in 4 months.  I, Lawson Radar, CMA, am acting as scribe for Armida Sans, MD.   Documentation: I have reviewed the above documentation for accuracy and completeness, and I agree with the above.  Armida Sans, MD

## 2023-07-29 ENCOUNTER — Encounter: Payer: Self-pay | Admitting: Dermatology

## 2023-11-29 ENCOUNTER — Ambulatory Visit: Payer: Medicare HMO | Admitting: Dermatology

## 2023-11-29 DIAGNOSIS — Z79899 Other long term (current) drug therapy: Secondary | ICD-10-CM

## 2023-11-29 DIAGNOSIS — Z872 Personal history of diseases of the skin and subcutaneous tissue: Secondary | ICD-10-CM

## 2023-11-29 DIAGNOSIS — L821 Other seborrheic keratosis: Secondary | ICD-10-CM | POA: Diagnosis not present

## 2023-11-29 DIAGNOSIS — D2371 Other benign neoplasm of skin of right lower limb, including hip: Secondary | ICD-10-CM

## 2023-11-29 DIAGNOSIS — L820 Inflamed seborrheic keratosis: Secondary | ICD-10-CM

## 2023-11-29 DIAGNOSIS — Z7189 Other specified counseling: Secondary | ICD-10-CM

## 2023-11-29 DIAGNOSIS — L578 Other skin changes due to chronic exposure to nonionizing radiation: Secondary | ICD-10-CM

## 2023-11-29 DIAGNOSIS — D489 Neoplasm of uncertain behavior, unspecified: Secondary | ICD-10-CM

## 2023-11-29 DIAGNOSIS — W908XXA Exposure to other nonionizing radiation, initial encounter: Secondary | ICD-10-CM | POA: Diagnosis not present

## 2023-11-29 DIAGNOSIS — L570 Actinic keratosis: Secondary | ICD-10-CM | POA: Diagnosis not present

## 2023-11-29 DIAGNOSIS — I872 Venous insufficiency (chronic) (peripheral): Secondary | ICD-10-CM

## 2023-11-29 DIAGNOSIS — D492 Neoplasm of unspecified behavior of bone, soft tissue, and skin: Secondary | ICD-10-CM

## 2023-11-29 NOTE — Progress Notes (Unsigned)
Follow-Up Visit   Subjective  Juan Clark is a 73 y.o. male who presents for the following:  patient here for 4 month isk follow up. Hx of isks at scalp.   Spot at right arm and arms he would like checked.   The patient has spots, moles and lesions to be evaluated, some may be new or changing and the patient may have concern these could be cancer.   The following portions of the chart were reviewed this encounter and updated as appropriate: medications, allergies, medical history  Review of Systems:  No other skin or systemic complaints except as noted in HPI or Assessment and Plan.  Objective  Well appearing patient in no apparent distress; mood and affect are within normal limits.    A focused examination was performed of the following areas: Scalp   Relevant exam findings are noted in the Assessment and Plan.  Scalp and face x 25, back x 3 (28) Erythematous stuck-on, waxy papule or plaque left sideburn x 2 (2) Erythematous thin papules/macules with gritty scale.  right lateral thigh 0.7 cm pink flat firm papule    Assessment & Plan    STASIS DERMATITIS Exam: Erythematous, scaly patches involving the ankle and distal lower leg with associated lower leg edema.  Chronic and persistent condition with duration or expected duration over one year. Condition is improving with treatment but not currently at goal.   Stasis in the legs causes chronic leg swelling, which may result in itchy or painful rashes, skin discoloration, skin texture changes, and sometimes ulceration.  Recommend daily graduated compression hose/stockings- easiest to put on first thing in morning, remove at bedtime.  Elevate legs as much as possible. Avoid salt/sodium rich foods.  Treatment Plan: Continue Mometasone cream once or twice daily up to 5 days a week to legs. Wear compression socks daily as directed.    SEBORRHEIC KERATOSIS - Stuck-on, waxy, tan-brown papules and/or plaques  -  Benign-appearing - Discussed benign etiology and prognosis. - Observe - Call for any changes   ACTINIC DAMAGE - chronic, secondary to cumulative UV radiation exposure/sun exposure over time - diffuse scaly erythematous macules with underlying dyspigmentation - Recommend daily broad spectrum sunscreen SPF 30+ to sun-exposed areas, reapply every 2 hours as needed.  - Recommend staying in the shade or wearing long sleeves, sun glasses (UVA+UVB protection) and wide brim hats (4-inch brim around the entire circumference of the hat). - Call for new or changing lesions.   INFLAMED SEBORRHEIC KERATOSIS (28) Scalp and face x 25, back x 3 (28) Symptomatic, irritating, patient would like treated. Destruction of lesion - Scalp and face x 25, back x 3 (28) Complexity: simple   Destruction method: cryotherapy   Informed consent: discussed and consent obtained   Timeout:  patient name, date of birth, surgical site, and procedure verified Lesion destroyed using liquid nitrogen: Yes   Region frozen until ice ball extended beyond lesion: Yes   Outcome: patient tolerated procedure well with no complications   Post-procedure details: wound care instructions given   ACTINIC KERATOSIS (2) left sideburn x 2 (2) Actinic keratoses are precancerous spots that appear secondary to cumulative UV radiation exposure/sun exposure over time. They are chronic with expected duration over 1 year. A portion of actinic keratoses will progress to squamous cell carcinoma of the skin. It is not possible to reliably predict which spots will progress to skin cancer and so treatment is recommended to prevent development of skin cancer.  Recommend daily  broad spectrum sunscreen SPF 30+ to sun-exposed areas, reapply every 2 hours as needed.  Recommend staying in the shade or wearing long sleeves, sun glasses (UVA+UVB protection) and wide brim hats (4-inch brim around the entire circumference of the hat). Call for new or changing  lesions. Destruction of lesion - left sideburn x 2 (2) Complexity: simple   Destruction method: cryotherapy   Informed consent: discussed and consent obtained   Timeout:  patient name, date of birth, surgical site, and procedure verified Lesion destroyed using liquid nitrogen: Yes   Region frozen until ice ball extended beyond lesion: Yes   Outcome: patient tolerated procedure well with no complications   Post-procedure details: wound care instructions given   NEOPLASM OF UNCERTAIN BEHAVIOR right lateral thigh Epidermal / dermal shaving  Lesion diameter (cm):  0.7 Informed consent: discussed and consent obtained   Timeout: patient name, date of birth, surgical site, and procedure verified   Procedure prep:  Patient was prepped and draped in usual sterile fashion Prep type:  Isopropyl alcohol Anesthesia: the lesion was anesthetized in a standard fashion   Anesthetic:  1% lidocaine w/ epinephrine 1-100,000 buffered w/ 8.4% NaHCO3 Instrument used: flexible razor blade   Hemostasis achieved with: pressure, aluminum chloride and electrodesiccation   Outcome: patient tolerated procedure well   Post-procedure details: sterile dressing applied and wound care instructions given   Dressing type: bandage and petrolatum   Specimen 1 - Surgical pathology Differential Diagnosis: Dermatofibroma vs ca   Check Margins: No Dermatofibroma vs ca   Return for keep follow up as scheduled 9/24.  IAsher Muir, CMA, am acting as scribe for Armida Sans, MD.   Documentation: I have reviewed the above documentation for accuracy and completeness, and I agree with the above.  Armida Sans, MD

## 2023-11-29 NOTE — Patient Instructions (Addendum)
Biopsy Wound Care Instructions  Leave the original bandage on for 24 hours if possible.  If the bandage becomes soaked or soiled before that time, it is OK to remove it and examine the wound.  A small amount of post-operative bleeding is normal.  If excessive bleeding occurs, remove the bandage, place gauze over the site and apply continuous pressure (no peeking) over the area for 30 minutes. If this does not work, please call our clinic as soon as possible or page your doctor if it is after hours.   Once a day, cleanse the wound with soap and water. It is fine to shower. If a thick crust develops you may use a Q-tip dipped into dilute hydrogen peroxide (mix 1:1 with water) to dissolve it.  Hydrogen peroxide can slow the healing process, so use it only as needed.    After washing, apply petroleum jelly (Vaseline) or an antibiotic ointment if your doctor prescribed one for you, followed by a bandage.    For best healing, the wound should be covered with a layer of ointment at all times. If you are not able to keep the area covered with a bandage to hold the ointment in place, this may mean re-applying the ointment several times a day.  Continue this wound care until the wound has healed and is no longer open.   Itching and mild discomfort is normal during the healing process. However, if you develop pain or severe itching, please call our office.   If you have any discomfort, you can take Tylenol (acetaminophen) or ibuprofen as directed on the bottle. (Please do not take these if you have an allergy to them or cannot take them for another reason).  Some redness, tenderness and white or yellow material in the wound is normal healing.  If the area becomes very sore and red, or develops a thick yellow-green material (pus), it may be infected; please notify us.    If you have stitches, return to clinic as directed to have the stitches removed. You will continue wound care for 2-3 days after the stitches  are removed.   Wound healing continues for up to one year following surgery. It is not unusual to experience pain in the scar from time to time during the interval.  If the pain becomes severe or the scar thickens, you should notify the office.    A slight amount of redness in a scar is expected for the first six months.  After six months, the redness will fade and the scar will soften and fade.  The color difference becomes less noticeable with time.  If there are any problems, return for a post-op surgery check at your earliest convenience.  To improve the appearance of the scar, you can use silicone scar gel, cream, or sheets (such as Mederma or Serica) every night for up to one year. These are available over the counter (without a prescription).  Please call our office at [phone] for any questions or concerns.   Seborrheic Keratosis  What causes seborrheic keratoses? Seborrheic keratoses are harmless, common skin growths that first appear during adult life.  As time goes by, more growths appear.  Some people may develop a large number of them.  Seborrheic keratoses appear on both covered and uncovered body parts.  They are not caused by sunlight.  The tendency to develop seborrheic keratoses can be inherited.  They vary in color from skin-colored to gray, brown, or even black.  They can be  either smooth or have a rough, warty surface.   Seborrheic keratoses are superficial and look as if they were stuck on the skin.  Under the microscope this type of keratosis looks like layers upon layers of skin.  That is why at times the top layer may seem to fall off, but the rest of the growth remains and re-grows.    Treatment Seborrheic keratoses do not need to be treated, but can easily be removed in the office.  Seborrheic keratoses often cause symptoms when they rub on clothing or jewelry.  Lesions can be in the way of shaving.  If they become inflamed, they can cause itching, soreness, or  burning.  Removal of a seborrheic keratosis can be accomplished by freezing, burning, or surgery. If any spot bleeds, scabs, or grows rapidly, please return to have it checked, as these can be an indication of a skin cancer.   Cryotherapy Aftercare  Wash gently with soap and water everyday.   Apply Vaseline and Band-Aid daily until healed.    Due to recent changes in healthcare laws, you may see results of your pathology and/or laboratory studies on MyChart before the doctors have had a chance to review them. We understand that in some cases there may be results that are confusing or concerning to you. Please understand that not all results are received at the same time and often the doctors may need to interpret multiple results in order to provide you with the best plan of care or course of treatment. Therefore, we ask that you please give [place] 2 business days to thoroughly review all your results before contacting the office for clarification. Should we see a critical lab result, you will be contacted sooner.   If You Need Anything After Your Visit  If you have any questions or concerns for your doctor, please call our main line at [phone] and press option 4 to reach your doctor's medical assistant. If no one answers, please leave a voicemail as directed and we will return your call as soon as possible. Messages left after 4 pm will be answered the following business day.   You may also send [place] a message via MyChart. We typically respond to MyChart messages within 1-2 business days.  For prescription refills, please ask your pharmacy to contact our office. Our fax number is [phone].  If you have an urgent issue when the clinic is closed that cannot wait until the next business day, you can page your doctor at the number below.    Please note that while we do our best to be available for urgent issues outside of office hours, we are not available 24/7.   If you have an urgent issue  and are unable to reach [place], you may choose to seek medical care at your doctor's office, retail clinic, urgent care center, or emergency room.  If you have a medical emergency, please immediately call 911 or go to the emergency department.  Pager Numbers  - Dr. Gwen Pounds: [phone]  - Dr. Roseanne Reno: [phone]  - Dr. Katrinka Blazing: [phone]   In the event of inclement weather, please call our main line at [phone] for an update on the status of any delays or closures.  Dermatology Medication Tips: Please keep the boxes that topical medications come in in order to help keep track of the instructions about where and how to use these. Pharmacies typically print the medication instructions only on the boxes and not directly on the medication tubes.  If your medication is too expensive, please contact our office at [phone] option 4 or send [place] a message through MyChart.   We are unable to tell what your co-pay for medications will be in advance as this is different depending on your insurance coverage. However, we may be able to find a substitute medication at lower cost or fill out paperwork to get insurance to cover a needed medication.   If a prior authorization is required to get your medication covered by your insurance company, please allow [place] 1-2 business days to complete this process.  Drug prices often vary depending on where the prescription is filled and some pharmacies may offer cheaper prices.  The website www.goodrx.com contains coupons for medications through different pharmacies. The prices here do not account for what the cost may be with help from insurance (it may be cheaper with your insurance), but the website can give you the price if you did not use any insurance.  - You can print the associated coupon and take it with your prescription to the pharmacy.  - You may also stop by our office during regular business hours and pick up a GoodRx coupon card.  - If you  need your prescription sent electronically to a different pharmacy, notify our office through [facility] or by phone at [phone] option 4.     Si Usted Necesita Algo Despus de Su Visita  Tambin puede enviarnos un mensaje a travs de Clinical cytogeneticist. Por lo general respondemos a los mensajes de MyChart en el transcurso de 1 a 2 das hbiles.  Para renovar recetas, por favor pida a su farmacia que se ponga en contacto con nuestra oficina. Annie Sable de fax es [place] [phone].  Si tiene un asunto urgente cuando la clnica est cerrada y que no puede esperar hasta el siguiente da hbil, puede llamar/localizar a su doctor(a) al nmero que aparece a continuacin.   Por favor, tenga en cuenta que aunque hacemos todo lo posible para estar disponibles para asuntos urgentes fuera del horario de [place], no estamos disponibles las 24 horas del da, los 7 [address]  [address] de la [place].   Si tiene un problema urgente y no puede comunicarse con nosotros, puede optar por buscar atencin mdica  en el consultorio de su doctor(a), en una clnica privada, en un centro de atencin urgente o en una sala de emergencias.  Si tiene Engineer, drilling, por favor llame inmediatamente al 911 o vaya a la sala de emergencias.  Nmeros de bper  - Dr. Gwen Pounds: [phone]  - Dra. Roseanne Reno: [phone]  - Dr. Katrinka Blazing: [phone]   En caso de inclemencias del tiempo, por favor llame a Lacy Duverney principal al [phone] para una actualizacin sobre el [place] de cualquier retraso o cierre.  Consejos para la medicacin en dermatologa: Por favor, guarde las cajas en las que vienen los medicamentos de uso tpico para ayudarle a seguir las instrucciones sobre dnde y cmo usarlos. Las farmacias generalmente imprimen las instrucciones del medicamento slo en las cajas y no directamente en los tubos del [place].   Si su medicamento es muy caro, por favor, pngase en contacto con Rolm Gala llamando al  [phone] y presione la opcin 4 o envenos un mensaje a travs de Clinical cytogeneticist.   No podemos decirle cul ser su copago por los medicamentos por adelantado ya que esto es diferente dependiendo de la cobertura de su seguro. Sin embargo, es posible que podamos encontrar un medicamento sustituto a Audiological scientist  un formulario para que el seguro cubra el medicamento que se considera necesario.   Si se requiere una autorizacin previa para que su compaa de seguros [place] su medicamento, por favor permtanos de 1 a 2 das hbiles para completar [address]  Los precios de los medicamentos varan con frecuencia dependiendo del Environmental consultant de dnde se surte la receta y alguna farmacias pueden ofrecer precios ms baratos.  El sitio web www.goodrx.com tiene cupones para medicamentos de Health and safety inspector. Los precios aqu no tienen en cuenta lo que podra costar con la ayuda del seguro (puede ser ms barato con su seguro), pero el sitio web puede darle el precio si no utiliz Tourist information centre manager.  - Puede imprimir el cupn correspondiente y llevarlo con su receta a la farmacia.  - Tambin puede pasar por nuestra oficina durante el horario de atencin regular y Education officer, museum una tarjeta de cupones de GoodRx.  - Si necesita que su receta se enve electrnicamente a una farmacia diferente, informe a nuestra oficina a travs de MyChart de [place] o por telfono llamando al [phone] y presione la opcin 4.

## 2023-11-30 ENCOUNTER — Ambulatory Visit: Payer: Medicare HMO | Admitting: Dermatology

## 2023-11-30 LAB — SURGICAL PATHOLOGY

## 2023-12-05 ENCOUNTER — Telehealth: Payer: Self-pay

## 2023-12-05 NOTE — Telephone Encounter (Signed)
-----   Message from [place] sent at 12/05/2023  9:43 AM EST ----- Diagnosis: right lateral thigh :       DERMATOFIBROMA, BASE INVOLVED    Plan: please call to share that biopsy from right thigh revealed benign ball of scar tissue (dermatofibroma). No treatment needed

## 2023-12-05 NOTE — Telephone Encounter (Signed)
Patient advised pathology showed benign dermatofibroma, no treatment needed. Butch Penny., RMA

## 2023-12-19 ENCOUNTER — Encounter: Payer: Self-pay | Admitting: Dermatology

## 2024-08-01 ENCOUNTER — Ambulatory Visit: Payer: Medicare HMO | Admitting: Dermatology

## 2024-08-20 ENCOUNTER — Ambulatory Visit: Admitting: Dermatology

## 2024-08-20 DIAGNOSIS — Z7189 Other specified counseling: Secondary | ICD-10-CM

## 2024-08-20 DIAGNOSIS — D229 Melanocytic nevi, unspecified: Secondary | ICD-10-CM

## 2024-08-20 DIAGNOSIS — W908XXA Exposure to other nonionizing radiation, initial encounter: Secondary | ICD-10-CM

## 2024-08-20 DIAGNOSIS — L578 Other skin changes due to chronic exposure to nonionizing radiation: Secondary | ICD-10-CM | POA: Diagnosis not present

## 2024-08-20 DIAGNOSIS — L570 Actinic keratosis: Secondary | ICD-10-CM | POA: Diagnosis not present

## 2024-08-20 DIAGNOSIS — Z1283 Encounter for screening for malignant neoplasm of skin: Secondary | ICD-10-CM

## 2024-08-20 DIAGNOSIS — D1801 Hemangioma of skin and subcutaneous tissue: Secondary | ICD-10-CM

## 2024-08-20 DIAGNOSIS — L814 Other melanin hyperpigmentation: Secondary | ICD-10-CM

## 2024-08-20 DIAGNOSIS — L820 Inflamed seborrheic keratosis: Secondary | ICD-10-CM | POA: Diagnosis not present

## 2024-08-20 DIAGNOSIS — Z85828 Personal history of other malignant neoplasm of skin: Secondary | ICD-10-CM

## 2024-08-20 DIAGNOSIS — I872 Venous insufficiency (chronic) (peripheral): Secondary | ICD-10-CM | POA: Diagnosis not present

## 2024-08-20 DIAGNOSIS — L821 Other seborrheic keratosis: Secondary | ICD-10-CM

## 2024-08-20 DIAGNOSIS — Z79899 Other long term (current) drug therapy: Secondary | ICD-10-CM

## 2024-08-20 DIAGNOSIS — L817 Pigmented purpuric dermatosis: Secondary | ICD-10-CM

## 2024-08-20 MED ORDER — MOMETASONE FUROATE 0.1 % EX CREA: TOPICAL_CREAM | CUTANEOUS | 3 refills | Status: AC

## 2024-08-20 NOTE — Progress Notes (Unsigned)
 Follow-Up Visit   Subjective  Juan Clark is a 73 y.o. male who presents for the following: Skin Cancer Screening and Full Body Skin Exam  The patient presents for Total-Body Skin Exam (TBSE) for skin cancer screening and mole check. The patient has spots, moles and lesions to be evaluated, some may be new or changing and the patient may have concern these could be cancer.  The following portions of the chart were reviewed this encounter and updated as appropriate: medications, allergies, medical history  Review of Systems:  No other skin or systemic complaints except as noted in HPI or Assessment and Plan.  Objective  Well appearing patient in no apparent distress; mood and affect are within normal limits.  A full examination was performed including scalp, head, eyes, ears, nose, lips, neck, chest, axillae, abdomen, back, buttocks, bilateral upper extremities, bilateral lower extremities, hands, feet, fingers, toes, fingernails, and toenails. All findings within normal limits unless otherwise noted below.   Relevant physical exam findings are noted in the Assessment and Plan.  L preauricular x 1 Erythematous stuck-on, waxy papule or plaque R ear x 1 Erythematous thin papules/macules with gritty scale.   Assessment & Plan   SKIN CANCER SCREENING PERFORMED TODAY.  ACTINIC DAMAGE - Chronic condition, secondary to cumulative UV/sun exposure - diffuse scaly erythematous macules with underlying dyspigmentation - Recommend daily broad spectrum sunscreen SPF 30+ to sun-exposed areas, reapply every 2 hours as needed.  - Staying in the shade or wearing long sleeves, sun glasses (UVA+UVB protection) and wide brim hats (4-inch brim around the entire circumference of the hat) are also recommended for sun protection.  - Call for new or changing lesions.  LENTIGINES, SEBORRHEIC KERATOSES, HEMANGIOMAS - Benign normal skin lesions - Benign-appearing - Call for any changes  MELANOCYTIC  NEVI - Tan-brown and/or pink-flesh-colored symmetric macules and papules - Benign appearing on exam today - Observation - Call clinic for new or changing moles - Recommend daily use of broad spectrum spf 30+ sunscreen to sun-exposed areas.   HISTORY OF BASAL CELL CARCINOMA OF THE SKIN - No evidence of recurrence today - Recommend regular full body skin exams - Recommend daily broad spectrum sunscreen SPF 30+ to sun-exposed areas, reapply every 2 hours as needed.  - Call if any new or changing lesions are noted between office visits INFLAMED SEBORRHEIC KERATOSIS L preauricular x 1 Symptomatic, irritating, patient would like treated. Destruction of lesion - L preauricular x 1 Complexity: simple   Destruction method: cryotherapy   Informed consent: discussed and consent obtained   Timeout:  patient name, date of birth, surgical site, and procedure verified Lesion destroyed using liquid nitrogen: Yes   Region frozen until ice ball extended beyond lesion: Yes   Outcome: patient tolerated procedure well with no complications   Post-procedure details: wound care instructions given    ACTINIC KERATOSIS R ear x 1 Actinic keratoses are precancerous spots that appear secondary to cumulative UV radiation exposure/sun exposure over time. They are chronic with expected duration over 1 year. A portion of actinic keratoses will progress to squamous cell carcinoma of the skin. It is not possible to reliably predict which spots will progress to skin cancer and so treatment is recommended to prevent development of skin cancer.  Recommend daily broad spectrum sunscreen SPF 30+ to sun-exposed areas, reapply every 2 hours as needed.  Recommend staying in the shade or wearing long sleeves, sun glasses (UVA+UVB protection) and wide brim hats (4-inch brim around the entire  circumference of the hat). Call for new or changing lesions. 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  Chronic and persistent condition with duration or expected duration over one year. Condition is symptomatic/ bothersome to patient. Not currently at goal.  Stasis in the legs causes chronic leg swelling, which may result in itchy or painful rashes, skin discoloration, skin texture changes, and sometimes ulceration.  Recommend daily graduated compression hose/stockings- easiest to put on first thing in morning, remove at bedtime.  Elevate legs as much as possible. Avoid salt/sodium rich foods.  Treatment Plan: Continue Mometasone  0.1% cream to lower legs 3d/wk. Topical steroids (such as triamcinolone , fluocinolone, fluocinonide, mometasone , clobetasol, halobetasol, betamethasone, hydrocortisone) can cause thinning and lightening of the skin if they are used for too long in the same area. Your physician has selected the right strength medicine for your problem and area affected on the body. Please use your medication only as directed by your physician to prevent side effects.  Related Medications mometasone  (ELOCON ) 0.1 % cream Apply to lower legs BID 3d/wk PRN.  Return in about 1 year (around 08/20/2025) for TBSE - hx BCC, ISK, AK.  I, Rosina Mayans, CMA, am acting as scribe for Alm Rhyme, MD .   Documentation: I have reviewed the above documentation for accuracy and completeness, and I agree with the above.  Alm Rhyme, MD

## 2024-08-20 NOTE — Patient Instructions (Signed)

## 2024-08-21 ENCOUNTER — Encounter: Payer: Self-pay | Admitting: Dermatology

## 2025-08-21 ENCOUNTER — Ambulatory Visit: Admitting: Dermatology
